# Patient Record
Sex: Male | Born: 1945 | Race: White | Hispanic: No | Marital: Married | Smoking: Never smoker
Health system: Southern US, Community
[De-identification: ages and names within clinical notes are randomized; demographics above are authoritative.]

## PROBLEM LIST (undated history)

## (undated) DIAGNOSIS — E785 Hyperlipidemia, unspecified: Secondary | ICD-10-CM

## (undated) DIAGNOSIS — I10 Essential (primary) hypertension: Secondary | ICD-10-CM

## (undated) DIAGNOSIS — I2699 Other pulmonary embolism without acute cor pulmonale: Secondary | ICD-10-CM

## (undated) DIAGNOSIS — I4892 Unspecified atrial flutter: Secondary | ICD-10-CM

## (undated) HISTORY — DX: Essential (primary) hypertension: I10

## (undated) HISTORY — DX: Hyperlipidemia, unspecified: E78.5

## (undated) HISTORY — PX: NO PAST SURGERIES: SHX2092

---

## 2004-07-25 ENCOUNTER — Ambulatory Visit: Payer: Self-pay | Admitting: Internal Medicine

## 2004-08-06 ENCOUNTER — Ambulatory Visit: Payer: Self-pay | Admitting: Internal Medicine

## 2004-08-06 ENCOUNTER — Inpatient Hospital Stay: Payer: Self-pay | Admitting: Internal Medicine

## 2004-08-22 ENCOUNTER — Ambulatory Visit: Payer: Self-pay | Admitting: Internal Medicine

## 2004-09-18 ENCOUNTER — Ambulatory Visit: Payer: Self-pay | Admitting: Internal Medicine

## 2005-05-22 ENCOUNTER — Emergency Department: Payer: Self-pay | Admitting: Emergency Medicine

## 2005-12-05 ENCOUNTER — Emergency Department: Payer: Self-pay | Admitting: Internal Medicine

## 2006-07-04 IMAGING — CT CT STONE STUDY
1 of 2 series · 16 of 32 positions shown, 20 images · non-contrast
Comparison: none

REASON FOR EXAM: Abdominal pain. Rm 11
COMMENTS:

[Series 2: stone · axial · 0.66mm/px · z∈[-506,-84]mm · 16 of 153 slices shown, 20 images]
[im 6/153  soft-tissue]
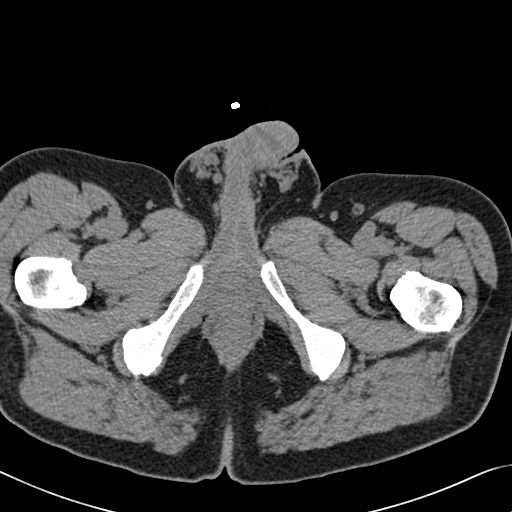
[im 6/153  bone]
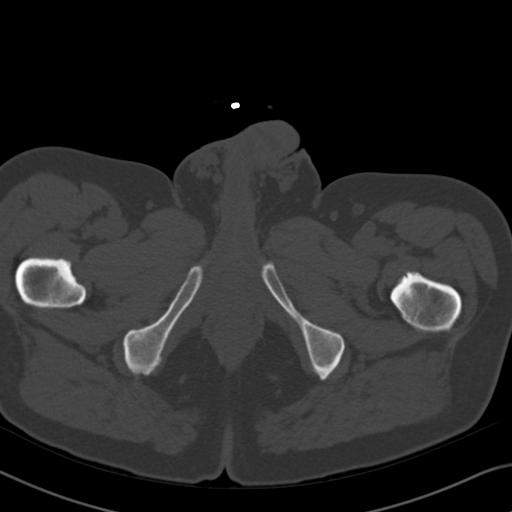
[im 17/153  soft-tissue]
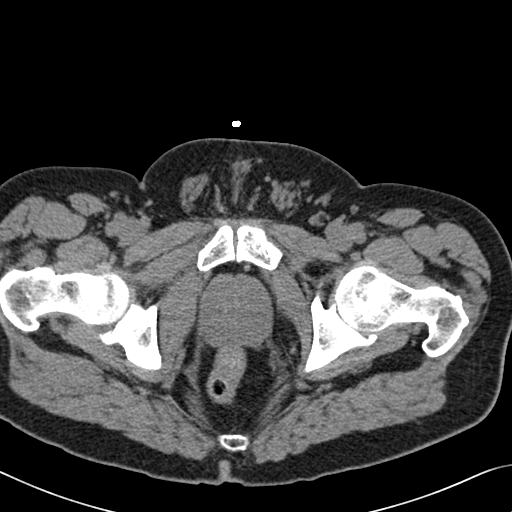
[im 28/153  soft-tissue]
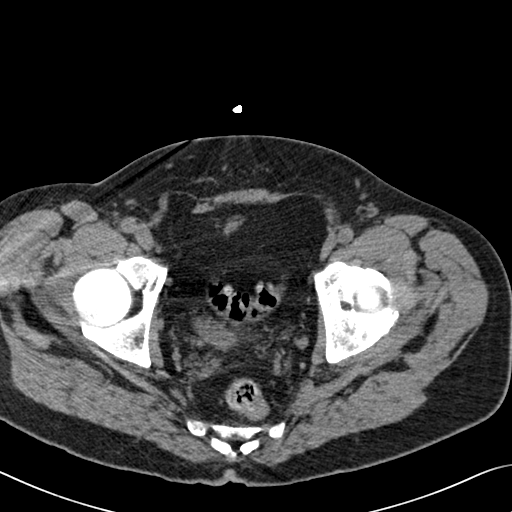
[im 39/153  soft-tissue]
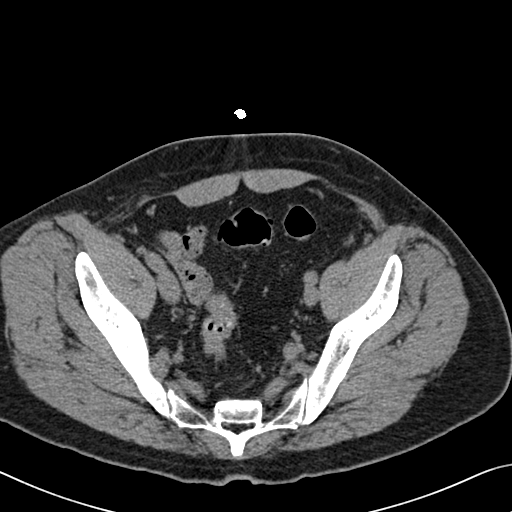
[im 49/153  soft-tissue]
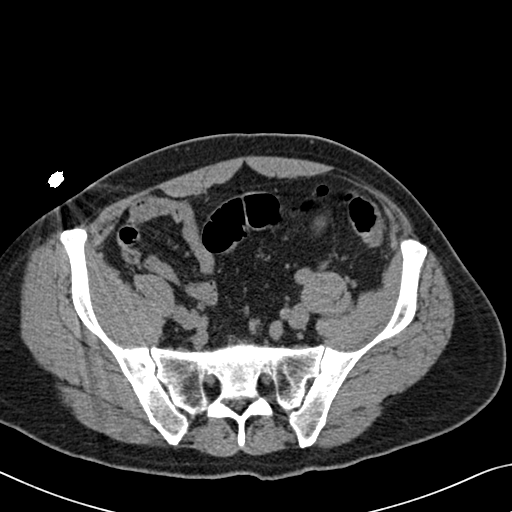
[im 60/153  soft-tissue]
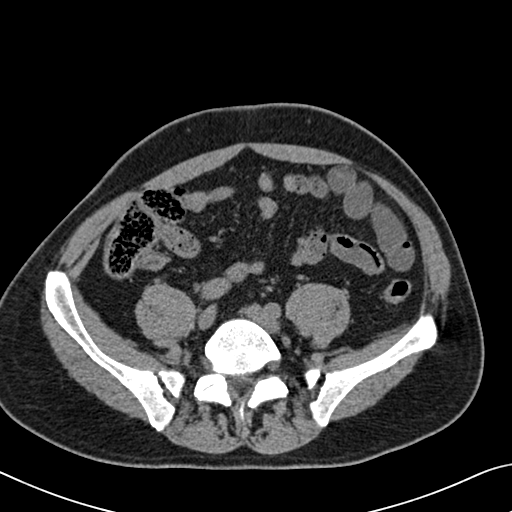
[im 71/153  soft-tissue]
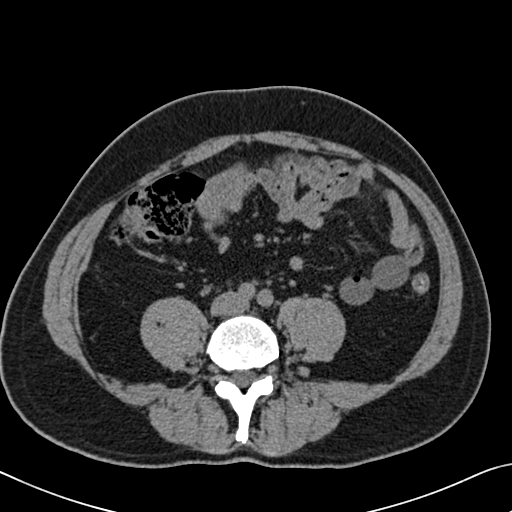
[im 82/153  soft-tissue]
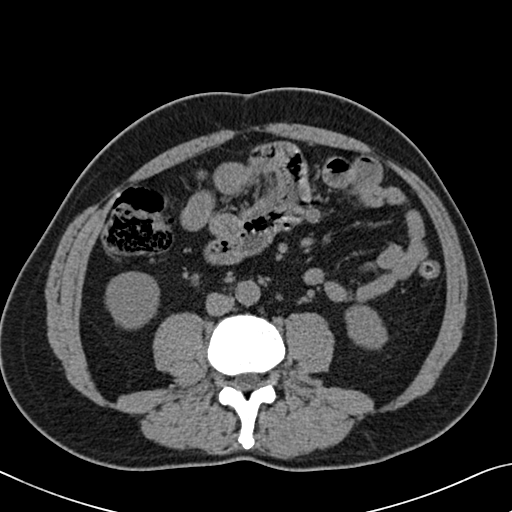
[im 93/153  soft-tissue]
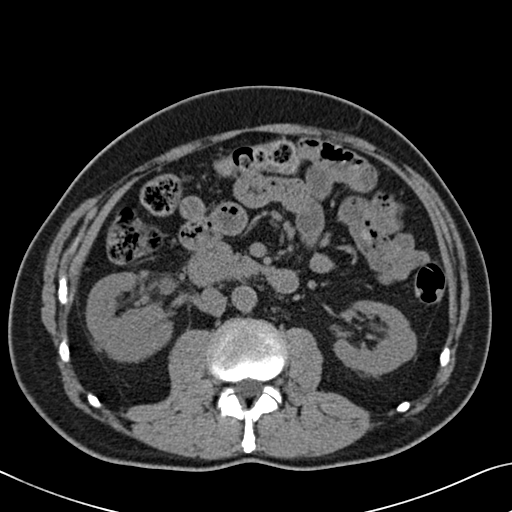
[im 93/153  bone]
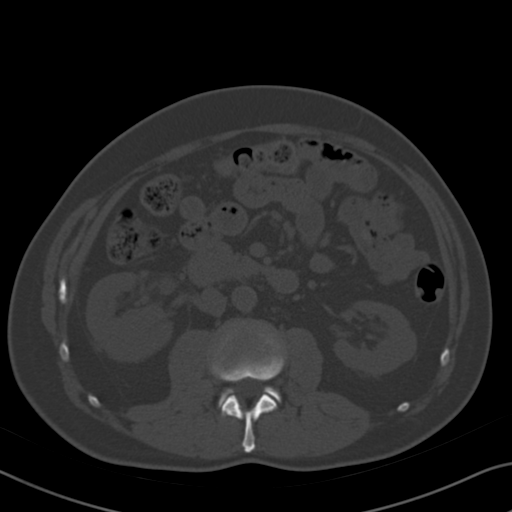
[im 104/153  soft-tissue]
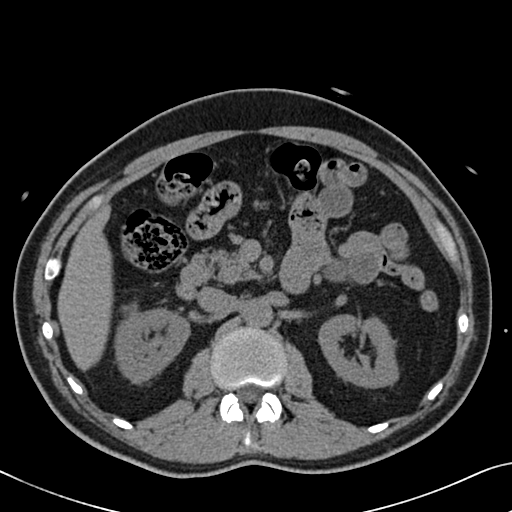
[im 115/153  soft-tissue]
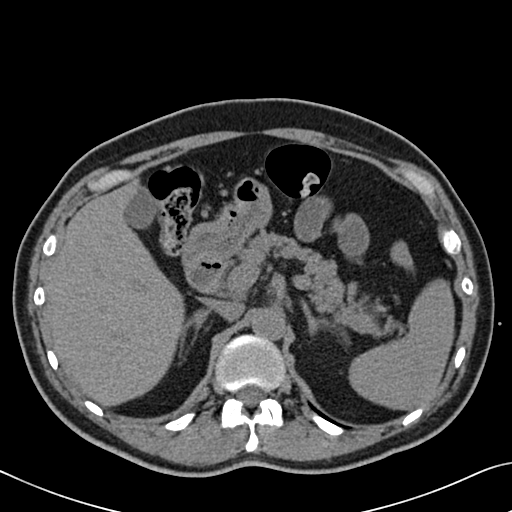
[im 125/153  soft-tissue]
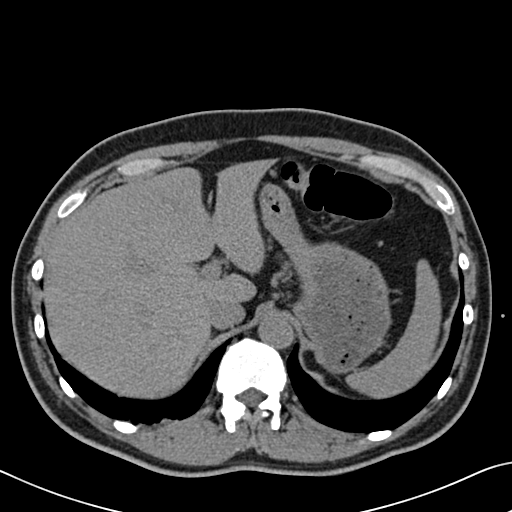
[im 131/153  lung]
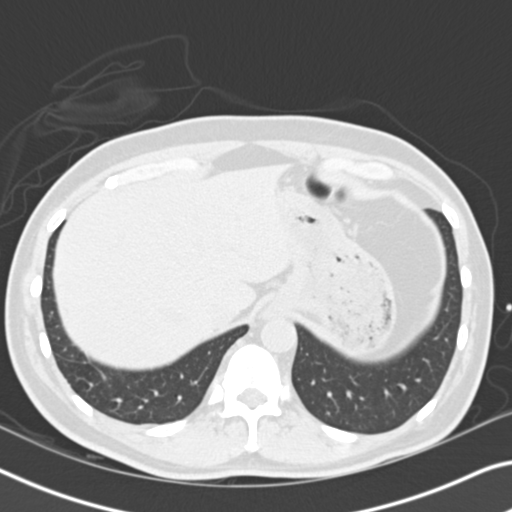
[im 136/153  soft-tissue]
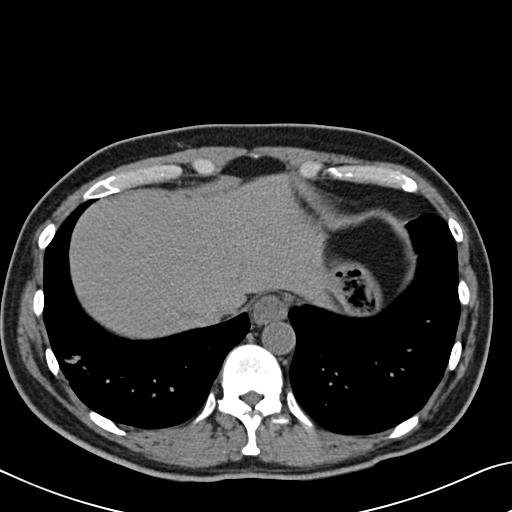
[im 136/153  lung]
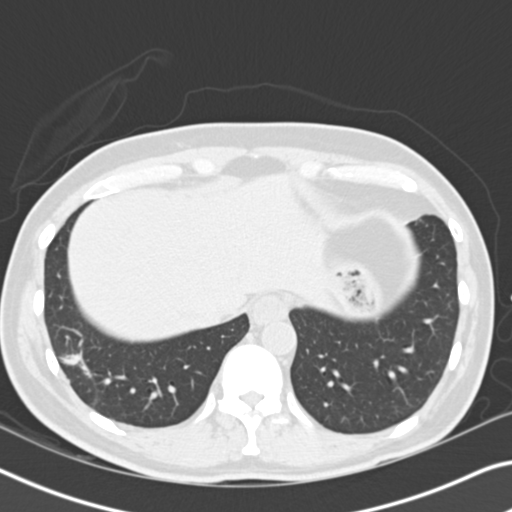
[im 142/153  lung]
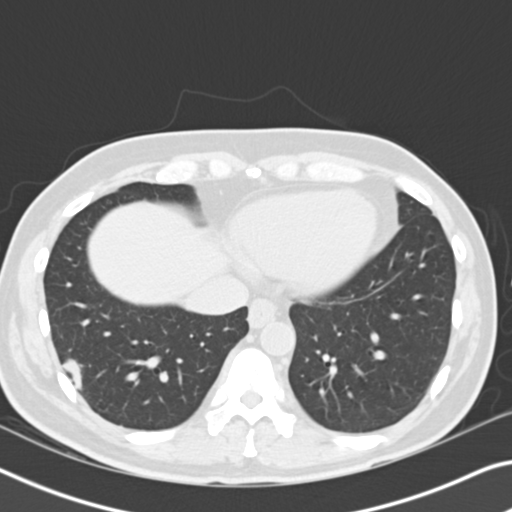
[im 147/153  soft-tissue]
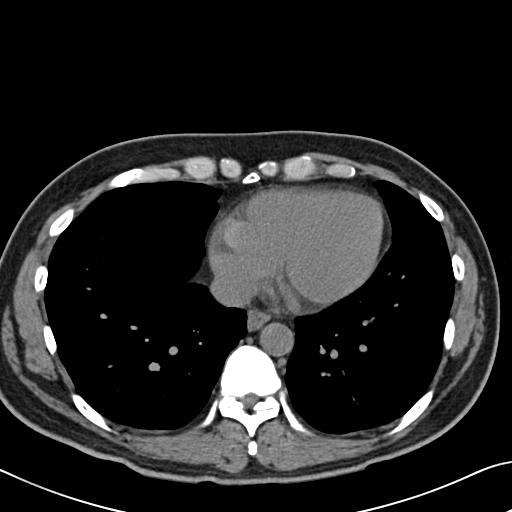
[im 147/153  lung]
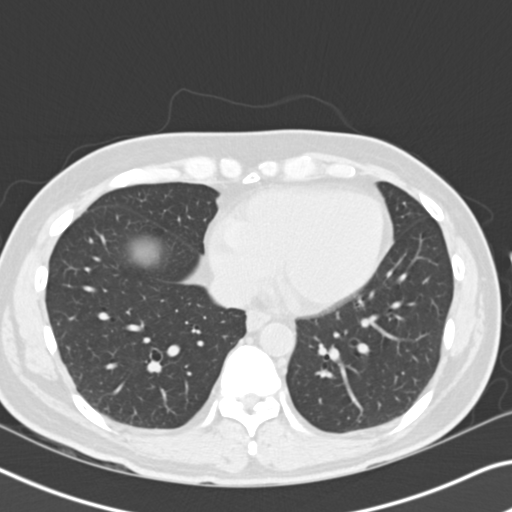

[16 of 32 positions shown; findings below may reference images not displayed]

PROCEDURE:     CT  - CT ABDOMEN /PELVIS WO (STONE)  - May 22, 2005  [DATE]

RESULT:     Unenhanced CT scan is performed for RIGHT flank pain.  The
report was initially faxed to the emergency room.  There is noted a distal
RIGHT ureteral calculus above the ureterovesical junction measuring 2.8 mm.
Very mild hydronephrosis is noted on the RIGHT.  No renal calculi are noted
in either kidney.  No fluid is noted in the abdomen or pelvis.  No major
organ abnormalities are noted.

In the lung bases there is noted residual possible scarring or atelectasis
in the RIGHT lung base and a previous effusion and possible atelectasis on
the previous CT of 08/08/04.
IMPRESSION: Distal RIGHT ureteral calculus with mild RIGHT hydronephrosis.

## 2008-03-13 ENCOUNTER — Ambulatory Visit: Payer: Self-pay | Admitting: Gastroenterology

## 2011-04-23 ENCOUNTER — Encounter: Payer: Self-pay | Admitting: Family Medicine

## 2014-08-01 ENCOUNTER — Ambulatory Visit: Payer: Self-pay | Admitting: Family Medicine

## 2014-08-15 ENCOUNTER — Encounter: Payer: Self-pay | Admitting: Family Medicine

## 2014-08-15 ENCOUNTER — Ambulatory Visit (INDEPENDENT_AMBULATORY_CARE_PROVIDER_SITE_OTHER): Payer: Medicare Other | Admitting: Family Medicine

## 2014-08-15 VITALS — BP 110/68 | HR 82 | Temp 98.0°F | Resp 16 | Ht 70.0 in | Wt 162.5 lb

## 2014-08-15 DIAGNOSIS — M7130 Other bursal cyst, unspecified site: Secondary | ICD-10-CM | POA: Diagnosis not present

## 2014-08-15 DIAGNOSIS — I10 Essential (primary) hypertension: Secondary | ICD-10-CM | POA: Insufficient documentation

## 2014-08-15 DIAGNOSIS — M1990 Unspecified osteoarthritis, unspecified site: Secondary | ICD-10-CM | POA: Insufficient documentation

## 2014-08-15 DIAGNOSIS — E785 Hyperlipidemia, unspecified: Secondary | ICD-10-CM | POA: Insufficient documentation

## 2014-08-15 MED ORDER — BENAZEPRIL-HYDROCHLOROTHIAZIDE 10-12.5 MG PO TABS: 10.0000 | ORAL_TABLET | Freq: Once | ORAL | Status: DC

## 2014-08-15 MED ORDER — METOPROLOL SUCCINATE ER 25 MG PO TB24: 25.0000 mg | ORAL_TABLET | Freq: Once | ORAL | Status: DC

## 2014-08-15 NOTE — Patient Instructions (Signed)
F/U 4 MO 

## 2014-08-15 NOTE — Progress Notes (Signed)
Name: Kevin Acosta L Acosta   MRN: [id number]    DOB: 08-01-1945   Date:08/15/2014       Progress Note  Subjective  Chief Complaint  Chief Complaint  Patient presents with  . Hyperlipidemia  . Hypertension    Hyperlipidemia This is a chronic problem. The current episode started more than 1 year ago. The problem is controlled. Recent lipid tests were reviewed and are normal. Factors aggravating his hyperlipidemia include fatty foods. Pertinent negatives include no chest pain, focal weakness, myalgias or shortness of breath. Current antihyperlipidemic treatment includes statins. The current treatment provides moderate improvement of lipids. There are no compliance problems.  Risk factors for coronary artery disease include dyslipidemia, hypertension and male sex.  Hypertension This is a chronic problem. The current episode started more than 1 year ago. The problem is unchanged. The problem is controlled. Pertinent negatives include no blurred vision, chest pain, headaches, neck pain, orthopnea, palpitations or shortness of breath. There are no associated agents to hypertension. Past treatments include beta blockers, ACE inhibitors and calcium channel blockers. The current treatment provides moderate improvement. There are no compliance problems.   Arthritis Presents for initial visit. The disease course has been worsening. He complains of pain, stiffness and joint swelling. Affected locations include the right MCP, left MCP, left PIP and left DIP. Associated symptoms include pain at night. Pertinent negatives include no diarrhea, dysuria, fever or weight loss. His past medical history is significant for osteoarthritis. His pertinent risk factors include overuse. Past treatments include acetaminophen. The treatment provided mild relief.      Past Medical History  Diagnosis Date  . Hyperlipidemia   . Hypertension     History  Substance Use Topics  . Smoking status: Never Smoker   . Smokeless  tobacco: Not on file  . Alcohol Use: No     Current outpatient prescriptions:  .  aspirin 81 MG tablet, Take 81 mg by mouth daily., Disp: , Rfl:  .  atorvastatin (LIPITOR) 20 MG tablet, Take 20 mg by mouth daily., Disp: , Rfl:  .  benazepril-hydrochlorthiazide (LOTENSIN HCT) 10-12.5 MG per tablet, Take 10-12.5 tablets by mouth once., Disp: 90 tablet, Rfl: 1 .  metoprolol succinate (TOPROL-XL) 25 MG 24 hr tablet, Take 1 tablet (25 mg total) by mouth once., Disp: 90 tablet, Rfl: 1  No Known Allergies  Review of Systems  Constitutional: Negative for fever, chills and weight loss.  HENT: Negative for congestion, hearing loss, sore throat and tinnitus.   Eyes: Negative for blurred vision, double vision and redness.  Respiratory: Negative for cough, hemoptysis and shortness of breath.   Cardiovascular: Negative for chest pain, palpitations, orthopnea, claudication and leg swelling.  Gastrointestinal: Negative for heartburn, nausea, vomiting, diarrhea, constipation and blood in stool.  Genitourinary: Negative for dysuria, urgency, frequency and hematuria.  Musculoskeletal: Positive for joint pain, joint swelling, arthritis and stiffness. Negative for myalgias, back pain, falls and neck pain.  Skin: Negative for itching.  Neurological: Negative for dizziness, tingling, tremors, focal weakness, seizures, loss of consciousness, weakness and headaches.  Endo/Heme/Allergies: Does not bruise/bleed easily.  Psychiatric/Behavioral: Negative for depression and substance abuse. The patient is not nervous/anxious and does not have insomnia.      Objective  Filed Vitals:   08/15/14 0738  BP: 110/68  Pulse: 82  Temp: 98 F (36.7 C)  TempSrc: Oral  Resp: 16  Height: 5\' 10"  (1.778 m)  Weight: 162 lb 8 oz (73.71 kg)  SpO2: 97%     Physical  Exam  Constitutional: He is oriented to person, place, and time and well-developed, well-nourished, and in no distress.  HENT:  Head: Normocephalic.   Eyes: EOM are normal. Pupils are equal, round, and reactive to light.  Neck: Normal range of motion. Neck supple. No thyromegaly present.  Cardiovascular: Normal rate, regular rhythm and normal heart sounds.   No murmur heard. Pulmonary/Chest: Effort normal and breath sounds normal. No respiratory distress. He has no wheezes.  Abdominal: Soft. Bowel sounds are normal.  Musculoskeletal: He exhibits no edema.  ARTHRITIC CHANGES WITH SYNOVIAL CYST ON ONE DIP JT  Lymphadenopathy:    He has no cervical adenopathy.  Neurological: He is alert and oriented to person, place, and time. No cranial nerve deficit. Gait normal. Coordination normal.  Skin: Skin is warm and dry. No rash noted.  Psychiatric: Affect and judgment normal.      Assessment & Plan  1. Essential hypertension STABLE - metoprolol succinate (TOPROL-XL) 25 MG 24 hr tablet; Take 1 tablet (25 mg total) by mouth once.  Dispense: 90 tablet; Refill: 1  2. Arthritis USE OTC TYLENOL PRN  3. Synovial cyst REASSSURANCE

## 2014-10-17 ENCOUNTER — Other Ambulatory Visit: Payer: Self-pay | Admitting: Emergency Medicine

## 2014-10-17 MED ORDER — BENAZEPRIL-HYDROCHLOROTHIAZIDE 10-12.5 MG PO TABS: 10.0000 | ORAL_TABLET | Freq: Once | ORAL | Status: DC

## 2014-12-18 ENCOUNTER — Ambulatory Visit: Payer: Medicare Other | Admitting: Family Medicine

## 2014-12-25 ENCOUNTER — Ambulatory Visit (INDEPENDENT_AMBULATORY_CARE_PROVIDER_SITE_OTHER): Payer: Medicare Other | Admitting: Family Medicine

## 2014-12-25 ENCOUNTER — Encounter: Payer: Self-pay | Admitting: Family Medicine

## 2014-12-25 VITALS — BP 128/68 | HR 87 | Temp 98.0°F | Resp 18 | Ht 70.0 in | Wt 167.1 lb

## 2014-12-25 DIAGNOSIS — E785 Hyperlipidemia, unspecified: Secondary | ICD-10-CM

## 2014-12-25 DIAGNOSIS — I10 Essential (primary) hypertension: Secondary | ICD-10-CM | POA: Diagnosis not present

## 2014-12-25 DIAGNOSIS — N4340 Spermatocele of epididymis, unspecified: Secondary | ICD-10-CM

## 2014-12-25 NOTE — Progress Notes (Signed)
Name: Kevin BergerDaniel L Acosta   MRN: [id number]    DOB: 03/28/1945   Date:12/25/2014       Progress Note  Subjective  Chief Complaint  Chief Complaint  Patient presents with  . Hyperlipidemia  . Hypertension    HPI  Hypertension   Patient presents for follow-up of hypertension. It has been present for over 5 years.  Patient states that there is compliance with medical regimen which consists of Lotensin HCT 10-12 0.5 once daily metoprolol 25 mg once daily. . There is no end organ disease. Cardiac risk factors include hypertension hyperlipidemia and diabetes.  Exercise regimen consist of walking on a daily basis nearly .  Diet consist of low-sodium .  Hyperlipidemia  Patient has a history of hyperlipidemia for 5 years or moreyears.  Current medical regimen consist of atorvastatin 20 mg daily at bedtime .  Compliance is good .  Diet and exercise are currently followed regularly .  Risk factors for cardiovascular disease include hyperlipidemia hypertension and age .   There have been no side effects from the medication.    Spermatocele  Patient is noted a lump in his scrotal area the last several months. He has just begun to start checking himself on a regular basis. There is no associated pain and no trauma. He has not had a vasectomy. There is no pain with or difficulty with urination and nocturia or frequency. There's been no history of hematuria at any point    Past Medical History  Diagnosis Date  . Hyperlipidemia   . Hypertension     Social History  Substance Use Topics  . Smoking status: Never Smoker   . Smokeless tobacco: Not on file  . Alcohol Use: No     Current outpatient prescriptions:  .  aspirin 81 MG tablet, Take 81 mg by mouth daily., Disp: , Rfl:  .  atorvastatin (LIPITOR) 20 MG tablet, Take 20 mg by mouth daily., Disp: , Rfl:  .  benazepril-hydrochlorthiazide (LOTENSIN HCT) 10-12.5 MG per tablet, Take 10-12.5 tablets by mouth once., Disp: 90 tablet, Rfl: 0 .   metoprolol succinate (TOPROL-XL) 25 MG 24 hr tablet, Take 1 tablet (25 mg total) by mouth once., Disp: 90 tablet, Rfl: 1  No Known Allergies  Review of Systems  Constitutional: Negative for fever, chills and weight loss.  HENT: Negative for congestion, hearing loss, sore throat and tinnitus.   Eyes: Negative for blurred vision, double vision and redness.  Respiratory: Negative for cough, hemoptysis and shortness of breath.   Cardiovascular: Negative for chest pain, palpitations, orthopnea, claudication and leg swelling.  Gastrointestinal: Negative for heartburn, nausea, vomiting, diarrhea, constipation and blood in stool.  Genitourinary: Negative for dysuria, urgency, frequency and hematuria.       Small nodular area in the scrotum  Musculoskeletal: Negative for myalgias, back pain, joint pain, falls and neck pain.  Skin: Negative for itching.  Neurological: Negative for dizziness, tingling, tremors, focal weakness, seizures, loss of consciousness, weakness and headaches.  Endo/Heme/Allergies: Does not bruise/bleed easily.  Psychiatric/Behavioral: Negative for depression and substance abuse. The patient is not nervous/anxious and does not have insomnia.      Objective  Filed Vitals:   12/25/14 1138  BP: 128/68  Pulse: 87  Temp: 98 F (36.7 C)  Resp: 18  Height: 5\' 10"  (1.778 m)  Weight: 167 lb 1 oz (75.779 kg)  SpO2: 97%     Physical Exam  Constitutional: He is oriented to person, place, and time and well-developed, well-nourished,  and in no distress.  HENT:  Head: Normocephalic.  Eyes: EOM are normal. Pupils are equal, round, and reactive to light.  Neck: Normal range of motion. Neck supple. No thyromegaly present.  Cardiovascular: Normal rate, regular rhythm and normal heart sounds.   No murmur heard. Pulmonary/Chest: Effort normal and breath sounds normal. No respiratory distress. He has no wheezes.  Abdominal: Soft. Bowel sounds are normal.  Genitourinary:  There is  what feels to be a small spermatocele on the left testicle. It is mobile and nontender not at her to the testicle  Musculoskeletal: Normal range of motion. He exhibits no edema.  Lymphadenopathy:    He has no cervical adenopathy.  Neurological: He is alert and oriented to person, place, and time. No cranial nerve deficit. Gait normal. Coordination normal.  Skin: Skin is warm and dry. No rash noted.  Psychiatric: Affect and judgment normal.      Assessment & Plan  1. Essential hypertension Well-controlled - Comprehensive Metabolic Panel (CMET)  2. Hyperlipidemia Lab work - Lipid Profile - TSH  3. Spermatocele Reassurance. She did become tender or enlarged for her urologist

## 2015-01-15 LAB — COMPREHENSIVE METABOLIC PANEL
ALBUMIN: 4.3 g/dL (ref 3.6–4.8)
ALT: 22 IU/L (ref 0–44)
AST: 20 IU/L (ref 0–40)
Albumin/Globulin Ratio: 1.5 (ref 1.1–2.5)
Alkaline Phosphatase: 75 IU/L (ref 39–117)
BUN / CREAT RATIO: 19 (ref 10–22)
BUN: 19 mg/dL (ref 8–27)
Bilirubin Total: 0.7 mg/dL (ref 0.0–1.2)
CO2: 28 mmol/L (ref 18–29)
CREATININE: 1 mg/dL (ref 0.76–1.27)
Calcium: 9.8 mg/dL (ref 8.6–10.2)
Chloride: 97 mmol/L (ref 97–106)
GFR, EST AFRICAN AMERICAN: 88 mL/min/{1.73_m2} (ref >59)
GFR, EST NON AFRICAN AMERICAN: 76 mL/min/{1.73_m2} (ref >59)
Globulin, Total: 2.8 g/dL (ref 1.5–4.5)
Glucose: 114 mg/dL — ABNORMAL HIGH (ref 65–99)
Potassium: 4.7 mmol/L (ref 3.5–5.2)
Sodium: 139 mmol/L (ref 136–144)
TOTAL PROTEIN: 7.1 g/dL (ref 6.0–8.5)

## 2015-01-15 LAB — LIPID PANEL
Chol/HDL Ratio: 5.3 ratio units — ABNORMAL HIGH (ref 0.0–5.0)
Cholesterol, Total: 217 mg/dL — ABNORMAL HIGH (ref 100–199)
HDL: 41 mg/dL (ref >39)
LDL CALC: 148 mg/dL — AB (ref 0–99)
Triglycerides: 140 mg/dL (ref 0–149)
VLDL CHOLESTEROL CAL: 28 mg/dL (ref 5–40)

## 2015-01-15 LAB — TSH: TSH: 2.88 u[IU]/mL (ref 0.450–4.500)

## 2015-01-18 ENCOUNTER — Telehealth: Payer: Self-pay | Admitting: Emergency Medicine

## 2015-01-18 ENCOUNTER — Telehealth: Payer: Self-pay | Admitting: Family Medicine

## 2015-01-18 NOTE — Telephone Encounter (Signed)
Pt is asking for you all to call him and give his lab results to him again. Ph# is [phone].

## 2015-01-18 NOTE — Telephone Encounter (Signed)
Patient stated he is not taking any medication. Lipitor makes his feet hurt. Do not want to start back taking it. Will exercise and loose weight.

## 2015-01-21 ENCOUNTER — Other Ambulatory Visit: Payer: Self-pay

## 2015-01-21 MED ORDER — ATORVASTATIN CALCIUM 40 MG PO TABS: 20.0000 mg | ORAL_TABLET | Freq: Every day | ORAL | Status: DC

## 2015-01-21 NOTE — Telephone Encounter (Signed)
Left vm for pt to return my call.  

## 2015-02-19 ENCOUNTER — Other Ambulatory Visit: Payer: Self-pay | Admitting: Family Medicine

## 2015-02-19 DIAGNOSIS — I10 Essential (primary) hypertension: Secondary | ICD-10-CM

## 2015-02-19 MED ORDER — METOPROLOL SUCCINATE ER 25 MG PO TB24: 25.0000 mg | ORAL_TABLET | Freq: Once | ORAL | Status: DC

## 2015-02-26 ENCOUNTER — Other Ambulatory Visit: Payer: Self-pay | Admitting: Family Medicine

## 2015-04-16 ENCOUNTER — Other Ambulatory Visit: Payer: Self-pay | Admitting: Family Medicine

## 2015-05-27 ENCOUNTER — Ambulatory Visit: Payer: Medicare Other | Admitting: Family Medicine

## 2015-05-27 ENCOUNTER — Ambulatory Visit
Admission: EM | Admit: 2015-05-27 | Discharge: 2015-05-27 | Disposition: A | Discharge status: Home / Self Care | Payer: Medicare Other | Attending: Family Medicine | Admitting: Family Medicine

## 2015-05-27 ENCOUNTER — Encounter: Payer: Self-pay | Admitting: *Deleted

## 2015-05-27 DIAGNOSIS — J40 Bronchitis, not specified as acute or chronic: Secondary | ICD-10-CM

## 2015-05-27 DIAGNOSIS — J0110 Acute frontal sinusitis, unspecified: Secondary | ICD-10-CM

## 2015-05-27 LAB — RAPID INFLUENZA A&B ANTIGENS: Influenza B (ARMC): NEGATIVE

## 2015-05-27 LAB — RAPID INFLUENZA A&B ANTIGENS (ARMC ONLY): INFLUENZA A (ARMC): NEGATIVE

## 2015-05-27 MED ORDER — AMOXICILLIN-POT CLAVULANATE 875-125 MG PO TABS: 1.0000 | ORAL_TABLET | Freq: Two times a day (BID) | ORAL | Status: DC

## 2015-05-27 MED ORDER — LORATADINE 10 MG PO TABS: 10.0000 mg | ORAL_TABLET | Freq: Every day | ORAL | Status: DC

## 2015-05-27 MED ORDER — BENZONATATE 100 MG PO CAPS: 100.0000 mg | ORAL_CAPSULE | Freq: Three times a day (TID) | ORAL | Status: DC | PRN

## 2015-05-27 NOTE — ED Provider Notes (Signed)
[facility]  ____________________________________________  Time seen: Approximately 10:42 AM  I have reviewed the triage vital signs and the nursing notes.   HISTORY  Chief Complaint Cough and Nasal Congestion   HPI Kevin Acosta is a 70 y.o. male   presents with a complaint of one week of runny nose, nasal congestion, sinus congestion and cough. Patient reports that initially he was only having nasal drainage and sinus congestion but states the last few days he has developed a dry hacking cough. Patient states that sinuses at this point feel clogged with mild pressure in his forehead that's consistent with previous sinus pressure. Reports some watery eyes. Denies sneezing. Denies sore throat. States the cough is generally worse in the afternoon and evenings. Reports he does have a history of seasonal allergies but reports that his allergies have not bothered him in the last few years. Reports his son and their family have recently had the flu but states he has not directly been around them. Denies other known sick contacts.. Denies fevers.   Patient reports that he did take an over-the-counter cough and congestion medicine the other day and noticed that his blood pressure went up while taking that medicine, states that he will stop using that medicine. States as soon as he stopped the medicine at home his blood pressure returned to his normal 120s/80s.   Denies chest pain, shortness of breath, chest pain with deep breath, palpitations, dizziness, weakness, vision changes, headaches, abdominal pain, dysuria, neck pain, back pain, rash, extremity pain, extremity swelling.  PCP: Charlette Caffey   Past Medical History  Diagnosis Date  . Hyperlipidemia   . Hypertension     Patient Active Problem List   Diagnosis Date Noted  . Hyperlipemia 08/15/2014  . Essential hypertension 08/15/2014  . Arthritis 08/15/2014  . Synovial cyst 08/15/2014    Past Surgical History  Procedure  Laterality Date  . No past surgeries      Current Outpatient Rx  Name  Route  Sig  Dispense  Refill  . aspirin 81 MG tablet   Oral   Take 81 mg by mouth daily.         Marland Kitchen atorvastatin (LIPITOR) 40 MG tablet   Oral   Take 0.5 tablets (20 mg total) by mouth daily.   30 tablet   3   . benazepril-hydrochlorthiazide (LOTENSIN HCT) 10-12.5 MG tablet      1 Tablet, Oral, daily   90 tablet   0   . metoprolol succinate (TOPROL-XL) 25 MG 24 hr tablet      1 Tablet ER 24HR, Oral, daily   90 tablet   0     Allergies Review of patient's allergies indicates no known allergies.  Family History  Problem Relation Age of Onset  . Diabetes Mother     Social History Social History  Substance Use Topics  . Smoking status: Never Smoker   . Smokeless tobacco: None  . Alcohol Use: No    Review of Systems Constitutional: No fever/chills Eyes: No visual changes. ENT: No sore throat.Positive runny nose, congestion and cough. Cardiovascular: Denies chest pain. Respiratory: Denies shortness of breath. Gastrointestinal: No abdominal pain.  No nausea, no vomiting.  No diarrhea.  No constipation. Genitourinary: Negative for dysuria. Musculoskeletal: Negative for back pain. Skin: Negative for rash. Neurological: Negative for headaches, focal weakness or numbness.  10-point ROS otherwise negative.  ____________________________________________   PHYSICAL EXAM:  VITAL SIGNS: ED Triage Vitals  Enc Vitals Group  BP 05/27/15 1000 142/78 mmHg     Pulse Rate 05/27/15 1000 102 Recheck 92     Resp 05/27/15 1000 16     Temp 05/27/15 1000 99.2 F (37.3 C)     Temp Source 05/27/15 1000 Oral     SpO2 05/27/15 1000 99 %     Weight 05/27/15 1000 168 lb (76.204 kg)     Height 05/27/15 1000  (1.778 m)     Head Cir --      Peak Flow --      Pain Score --      Pain Loc --      Pain Edu? --      Excl. in GC? --    Today's Vitals   05/27/15 1000 05/27/15 1052  BP: 142/78  152/89  Pulse: 102 88  Temp: 99.2 F (37.3 C)   TempSrc: Oral   Resp: 16 18  Height:  (1.778 m)   Weight: 168 lb (76.204 kg)   SpO2: 99%     Constitutional: Alert and oriented. Well appearing and in no acute distress. Eyes: Conjunctivae are normal. PERRL. EOMI. Head: Atraumatic.Mild tenderness to palpation bilateral frontal sinuses; no maxillary sinus tenderness to palpation. No swelling. No erythema.   Ears: no erythema, normal TMs bilaterally.   Nose: nasal congestion with bilateral nasal turbinate erythema and edema.   Mouth/Throat: Mucous membranes are moist.  Oropharynx non-erythematous.No tonsillar swelling or exudate.  Neck: No stridor.  No cervical spine tenderness to palpation. Hematological/Lymphatic/Immunilogical: No cervical lymphadenopathy. Cardiovascular: Normal rate, regular rhythm. Grossly normal heart sounds.  Good peripheral circulation. Respiratory: Normal respiratory effort.  No retractions. Lungs CTAB. No wheezes, rales or rhonchi. Good air movement. Dry intermittent cough noted. Gastrointestinal: Soft and nontender. No distention.  No CVA tenderness. Musculoskeletal: No lower or upper extremity tenderness nor edema.  Bilateral pedal pulses equal and easily palpated. No cervical, thoracic or lumbar tenderness to palpation.  Neurologic:  Normal speech and language. No gross focal neurologic deficits are appreciated. No gait instability. Skin:  Skin is warm, dry and intact. No rash noted. Psychiatric: Mood and affect are normal. Speech and behavior are normal.  ____________________________________________   LABS (all labs ordered are listed, but only abnormal results are displayed)  Labs Reviewed  RAPID INFLUENZA A&B ANTIGENS ([facility] ONLY)     INITIAL IMPRESSION / ASSESSMENT AND PLAN / ED COURSE  Pertinent labs & imaging results that were available during my care of the patient were reviewed by me and considered in my medical decision making (see chart  for details).  Well-appearing patient. No acute distress. Present for the complaints of 1 week of runny nose, nasal congestion, sinus pressure, sinus drainage and cough. Lungs clear throughout. Abdomen soft and nontender. Mild frontal sinus tenderness with nasal drainage. As with patient with multiple co morbidities and with suspicious of sinusitis and mild bronchitis will treat patient with oral Augmentin, when necessary Tessalon Perles and Claritin. Suspect some allergic rhinitis. Encouraged rest, fluids, vital sign monitoring at home and PCP follow-up.Encourage patient to be careful of  taking over-the-counter medications, as to not increase his blood pressure and patient reports will be from now on.   Discussed follow up with Primary care physician this week. Discussed follow up and return parameters including no resolution or any worsening concerns. Patient verbalized understanding and agreed to plan.    ____________________________________________   FINAL CLINICAL IMPRESSION(S) / ED DIAGNOSES  Final diagnoses:  Acute frontal sinusitis, recurrence not specified  Bronchitis  Note: This dictation was prepared with Dragon dictation along with smaller phrase technology. Any transcriptional errors that result from this process are unintentional.    Renford DillsLindsey Xzaria Teo, NP 05/27/15 1103  Renford DillsLindsey Sarahjane Matherly, NP 05/27/15 1103

## 2015-05-27 NOTE — ED Notes (Signed)
Non-productive cough and hed/nose congestion x1 week. Denied fever.

## 2015-05-27 NOTE — Discharge Instructions (Signed)
Take medication as prescribed. Rest. Drink plenty of fluids.  ° °Follow up with your primary care physician this week as needed. Return to Urgent care for new or worsening concerns.  ° ° °Sinusitis, Adult °Sinusitis is redness, soreness, and inflammation of the paranasal sinuses. Paranasal sinuses are air pockets within the bones of your face. They are located beneath your eyes, in the middle of your forehead, and above your eyes. In healthy paranasal sinuses, mucus is able to drain out, and air is able to circulate through them by way of your nose. However, when your paranasal sinuses are inflamed, mucus and air can become trapped. This can allow bacteria and other germs to grow and cause infection. °Sinusitis can develop quickly and last only a short time (acute) or continue over a long period (chronic). Sinusitis that lasts for more than 12 weeks is considered chronic. °CAUSES °Causes of sinusitis include: °· Allergies. °· Structural abnormalities, such as displacement of the cartilage that separates your nostrils (deviated septum), which can decrease the air flow through your nose and sinuses and affect sinus drainage. °· Functional abnormalities, such as when the small hairs (cilia) that line your sinuses and help remove mucus do not work properly or are not present. °SIGNS AND SYMPTOMS °Symptoms of acute and chronic sinusitis are the same. The primary symptoms are pain and pressure around the affected sinuses. Other symptoms include: °· Upper toothache. °· Earache. °· Headache. °· Bad breath. °· Decreased sense of smell and taste. °· A cough, which worsens when you are lying flat. °· Fatigue. °· Fever. °· Thick drainage from your nose, which often is green and may contain pus (purulent). °· Swelling and warmth over the affected sinuses. °DIAGNOSIS °Your health care provider will perform a physical exam. During your exam, your health care provider may perform any of the following to help determine if you have  acute sinusitis or chronic sinusitis: °· Look in your nose for signs of abnormal growths in your nostrils (nasal polyps). °· Tap over the affected sinus to check for signs of infection. °· View the inside of your sinuses using an imaging device that has a light attached (endoscope). °If your health care provider suspects that you have chronic sinusitis, one or more of the following tests may be recommended: °· Allergy tests. °· Nasal culture. A sample of mucus is taken from your nose, sent to a lab, and screened for bacteria. °· Nasal cytology. A sample of mucus is taken from your nose and examined by your health care provider to determine if your sinusitis is related to an allergy. °TREATMENT °Most cases of acute sinusitis are related to a viral infection and will resolve on their own within 10 days. Sometimes, medicines are prescribed to help relieve symptoms of both acute and chronic sinusitis. These may include pain medicines, decongestants, nasal steroid sprays, or saline sprays. °However, for sinusitis related to a bacterial infection, your health care provider will prescribe antibiotic medicines. These are medicines that will help kill the bacteria causing the infection. °Rarely, sinusitis is caused by a fungal infection. In these cases, your health care provider will prescribe antifungal medicine. °For some cases of chronic sinusitis, surgery is needed. Generally, these are cases in which sinusitis recurs more than 3 times per year, despite other treatments. °HOME CARE INSTRUCTIONS °· Drink plenty of water. Water helps thin the mucus so your sinuses can drain more easily. °· Use a humidifier. °· Inhale steam 3-4 times a day (for example, sit in   the bathroom with the shower running). °· Apply a warm, moist washcloth to your face 3-4 times a day, or as directed by your health care provider. °· Use saline nasal sprays to help moisten and clean your sinuses. °· Take medicines only as directed by your health care  provider. °· If you were prescribed either an antibiotic or antifungal medicine, finish it all even if you start to feel better. °SEEK IMMEDIATE MEDICAL CARE IF: °· You have increasing pain or severe headaches. °· You have nausea, vomiting, or drowsiness. °· You have swelling around your face. °· You have vision problems. °· You have a stiff neck. °· You have difficulty breathing. °  °This information is not intended to replace advice given to you by your health care provider. Make sure you discuss any questions you have with your health care provider. °  °Document Released: 01/26/2005 Document Revised: 02/16/2014 Document Reviewed: 02/10/2011 °[employer] Interactive Patient Education ©2016 [employer] ° °

## 2015-06-03 ENCOUNTER — Encounter: Payer: Self-pay | Admitting: Family Medicine

## 2015-06-03 ENCOUNTER — Ambulatory Visit (INDEPENDENT_AMBULATORY_CARE_PROVIDER_SITE_OTHER): Payer: Medicare Other | Admitting: Family Medicine

## 2015-06-03 VITALS — BP 124/68 | HR 91 | Temp 98.2°F | Resp 18 | Ht 70.0 in | Wt 171.0 lb

## 2015-06-03 DIAGNOSIS — J209 Acute bronchitis, unspecified: Secondary | ICD-10-CM

## 2015-06-03 MED ORDER — BENZONATATE 100 MG PO CAPS: 100.0000 mg | ORAL_CAPSULE | Freq: Three times a day (TID) | ORAL | Status: DC | PRN

## 2015-06-03 MED ORDER — PREDNISONE 10 MG (21) PO TBPK: 10.0000 mg | ORAL_TABLET | Freq: Every day | ORAL | Status: DC

## 2015-06-03 NOTE — Progress Notes (Signed)
Name: Maris BergerDaniel L Hoefer   MRN: [id number]    DOB: 02-Feb-1946   Date:06/03/2015       Progress Note  Subjective  Chief Complaint  Chief Complaint  Patient presents with  . Follow-up    Bronchitis    HPI  Pt. Presents for evaluation of bronchitis/sinus infection. Started 12 days ago, initial symptoms were nasal congestion and cough. [facility] and started on Augmentin, and Tessalon Perles. Pt. Feels much better, cough and some congestion is still there, no fevers.   Past Medical History  Diagnosis Date  . Hyperlipidemia   . Hypertension     Past Surgical History  Procedure Laterality Date  . No past surgeries      Family History  Problem Relation Age of Onset  . Diabetes Mother     Social History   Social History  . Marital Status: Married    Spouse Name: N/A  . Number of Children: N/A  . Years of Education: N/A   Occupational History  . Not on file.   Social History Main Topics  . Smoking status: Never Smoker   . Smokeless tobacco: Not on file  . Alcohol Use: No  . Drug Use: No  . Sexual Activity: Not on file   Other Topics Concern  . Not on file   Social History Narrative     Current outpatient prescriptions:  .  amoxicillin-clavulanate (AUGMENTIN) 875-125 MG tablet, Take 1 tablet by mouth every 12 (twelve) hours., Disp: 20 tablet, Rfl: 0 .  aspirin 81 MG tablet, Take 81 mg by mouth daily., Disp: , Rfl:  .  atorvastatin (LIPITOR) 40 MG tablet, Take 0.5 tablets (20 mg total) by mouth daily., Disp: 30 tablet, Rfl: 3 .  benazepril-hydrochlorthiazide (LOTENSIN HCT) 10-12.5 MG tablet, 1 Tablet, Oral, daily, Disp: 90 tablet, Rfl: 0 .  loratadine (CLARITIN) 10 MG tablet, Take 1 tablet (10 mg total) by mouth daily., Disp: 14 tablet, Rfl: 0 .  metoprolol succinate (TOPROL-XL) 25 MG 24 hr tablet, 1 Tablet ER 24HR, Oral, daily, Disp: 90 tablet, Rfl: 0 .  benzonatate (TESSALON PERLES) 100 MG capsule, Take 1 capsule (100 mg total) by mouth 3  (three) times daily as needed for cough. (Patient not taking: Reported on 06/03/2015), Disp: 15 capsule, Rfl: 0  No Known Allergies   Review of Systems  Constitutional: Negative for fever and chills.  HENT: Positive for congestion.   Respiratory: Positive for cough. Negative for shortness of breath and wheezing.   Cardiovascular: Negative for chest pain.    Objective  Filed Vitals:   06/03/15 1108  BP: 124/68  Pulse: 91  Temp: 98.2 F (36.8 C)  TempSrc: Oral  Resp: 18  Height: 5\' 10"  (1.778 m)  Weight: 171 lb (77.565 kg)  SpO2: 95%    Physical Exam  Constitutional: He is oriented to person, place, and time and well-developed, well-nourished, and in no distress.  HENT:  Head: Normocephalic and atraumatic.  Nose: Right sinus exhibits no maxillary sinus tenderness and no frontal sinus tenderness. Left sinus exhibits no maxillary sinus tenderness and no frontal sinus tenderness.  Mouth/Throat: Posterior oropharyngeal erythema present. No oropharyngeal exudate.  Cardiovascular: Normal rate, regular rhythm, S1 normal and S2 normal.   Pulmonary/Chest: He has wheezes in the left middle field and the left lower field.  Expiratory wheezing in the left lung, no crackles  Neurological: He is alert and oriented to person, place, and time.  Nursing note and vitals reviewed.  Assessment & Plan  1. Acute bronchitis, unspecified organism Patient is to continue and finish the antibiotic therapy prescribed by the urgent care, will start on tapering course of prednisone and refill Tessalon. Symptoms should improve within the next 2-3 days. - predniSONE (STERAPRED UNI-PAK 21 TAB) 10 MG (21) TBPK tablet; Take 1 tablet (10 mg total) by mouth daily. 60 50 40 then STOP  Dispense: 21 tablet; Refill: 0 - benzonatate (TESSALON PERLES) 100 MG capsule; Take 1 capsule (100 mg total) by mouth 3 (three) times daily as needed for cough.  Dispense: 15 capsule; Refill: 0   Ashea Winiarski Asad A.  [facility] 06/03/2015 11:16 AM

## 2015-06-24 ENCOUNTER — Encounter: Payer: Medicare Other | Admitting: Family Medicine

## 2015-07-16 ENCOUNTER — Other Ambulatory Visit: Payer: Self-pay | Admitting: Family Medicine

## 2015-07-18 ENCOUNTER — Ambulatory Visit (INDEPENDENT_AMBULATORY_CARE_PROVIDER_SITE_OTHER): Payer: Medicare Other | Admitting: Family Medicine

## 2015-07-18 ENCOUNTER — Encounter: Payer: Self-pay | Admitting: Family Medicine

## 2015-07-18 VITALS — BP 118/80 | HR 89 | Temp 98.6°F | Resp 18 | Ht 70.0 in | Wt 165.2 lb

## 2015-07-18 DIAGNOSIS — Z86711 Personal history of pulmonary embolism: Secondary | ICD-10-CM | POA: Insufficient documentation

## 2015-07-18 DIAGNOSIS — I10 Essential (primary) hypertension: Secondary | ICD-10-CM

## 2015-07-18 DIAGNOSIS — E785 Hyperlipidemia, unspecified: Secondary | ICD-10-CM | POA: Diagnosis not present

## 2015-07-18 DIAGNOSIS — R739 Hyperglycemia, unspecified: Secondary | ICD-10-CM | POA: Diagnosis not present

## 2015-07-18 MED ORDER — ATORVASTATIN CALCIUM 40 MG PO TABS: 20.0000 mg | ORAL_TABLET | ORAL | Status: DC

## 2015-07-18 MED ORDER — LORATADINE 10 MG PO TABS: 10.0000 mg | ORAL_TABLET | Freq: Every day | ORAL | Status: DC | PRN

## 2015-07-18 MED ORDER — BENAZEPRIL-HYDROCHLOROTHIAZIDE 10-12.5 MG PO TABS: 1.0000 | ORAL_TABLET | Freq: Every day | ORAL | Status: DC

## 2015-07-18 MED ORDER — METOPROLOL SUCCINATE ER 25 MG PO TB24: 25.0000 mg | ORAL_TABLET | Freq: Every day | ORAL | Status: DC

## 2015-07-18 MED ORDER — ATORVASTATIN CALCIUM 40 MG PO TABS: 40.0000 mg | ORAL_TABLET | ORAL | Status: DC

## 2015-07-18 NOTE — Progress Notes (Signed)
Date:  07/18/2015   Name:  Kevin Acosta   DOB:  1946-01-14   MRN:  [id number]  PCP:  Dennison MascotLemont Morrisey, MD    Chief Complaint: Medication Refill   History of Present Illness:  This is a 70 y.o. male seen in six month f/u. On asa s/p PE years ago. Needs refill metoprolol, Lotensin HCT, and Lipitor. No labs since December, hyperglycemia noted then.  Review of Systems:  Review of Systems  Constitutional: Negative for fever and fatigue.  Respiratory: Negative for cough and shortness of breath.   Cardiovascular: Negative for chest pain and leg swelling.  Endocrine: Negative for polyuria.  Genitourinary: Negative for difficulty urinating.  Neurological: Negative for syncope and light-headedness.    Patient Active Problem List   Diagnosis Date Noted  . Acute bronchitis 06/03/2015  . Hyperlipidemia 08/15/2014  . Essential hypertension 08/15/2014  . Arthritis 08/15/2014  . Synovial cyst 08/15/2014    Prior to Admission medications   Medication Sig Start Date End Date Taking? Authorizing Provider  aspirin 81 MG tablet Take 81 mg by mouth daily.    Historical Provider, MD  atorvastatin (LIPITOR) 40 MG tablet Take 1 tablet (40 mg total) by mouth every other day. 07/18/15   Schuyler AmorWilliam Rolena Knutson, MD  benazepril-hydrochlorthiazide (LOTENSIN HCT) 10-12.5 MG tablet Take 1 tablet by mouth daily. 07/18/15   Schuyler AmorWilliam Javarri Segal, MD  loratadine (CLARITIN) 10 MG tablet Take 1 tablet (10 mg total) by mouth daily as needed for allergies. 07/18/15   Schuyler AmorWilliam Evelynne Spiers, MD  metoprolol succinate (TOPROL-XL) 25 MG 24 hr tablet Take 1 tablet (25 mg total) by mouth daily. 07/18/15   Schuyler AmorWilliam Oliviana Mcgahee, MD    No Known Allergies  Past Surgical History  Procedure Laterality Date  . No past surgeries      Social History  Substance Use Topics  . Smoking status: Never Smoker   . Smokeless tobacco: None  . Alcohol Use: No    Family History  Problem Relation Age of Onset  . Diabetes Mother     Medication list has been  reviewed and updated.  Physical Examination: BP 118/80 mmHg  Pulse 89  Temp(Src) 98.6 F (37 C)  Resp 18  Ht 5\' 10"  (1.778 m)  Wt 165 lb 4 oz (74.957 kg)  BMI 23.71 kg/m2  SpO2 98%  Physical Exam  Constitutional: He appears well-developed and well-nourished.  Cardiovascular: Normal rate, regular rhythm and normal heart sounds.   Pulmonary/Chest: Effort normal and breath sounds normal.  Musculoskeletal: He exhibits no edema.  Neurological: He is alert.  Skin: Skin is warm and dry.  Psychiatric: He has a normal mood and affect. His behavior is normal.  Nursing note and vitals reviewed.   Assessment and Plan:  1. Essential hypertension Well controlled, refill metoprolol/Lotensin HCT - Comprehensive Metabolic Panel (CMET)  2. Hyperlipidemia Well controlled on Lipitor - Lipid Profile  3. Hyperglycemia - HgB A1c  4. Hx pulmonary embolism Cont asa  Return in about 6 months (around 01/17/2016).  Kevin AnoWilliam M. Kingsley SpittlePlonk, Jr. MD [facility]  07/18/2015

## 2015-07-19 ENCOUNTER — Encounter: Payer: Self-pay | Admitting: Family Medicine

## 2015-07-19 DIAGNOSIS — R7303 Prediabetes: Secondary | ICD-10-CM | POA: Insufficient documentation

## 2015-07-19 LAB — COMPREHENSIVE METABOLIC PANEL
A/G RATIO: 1.5 (ref 1.2–2.2)
ALT: 30 IU/L (ref 0–44)
AST: 27 IU/L (ref 0–40)
Albumin: 4.7 g/dL (ref 3.5–4.8)
Alkaline Phosphatase: 83 IU/L (ref 39–117)
BUN/Creatinine Ratio: 22 (ref 10–24)
BUN: 21 mg/dL (ref 8–27)
Bilirubin Total: 1.1 mg/dL (ref 0.0–1.2)
CALCIUM: 10 mg/dL (ref 8.6–10.2)
CO2: 26 mmol/L (ref 18–29)
CREATININE: 0.94 mg/dL (ref 0.76–1.27)
Chloride: 96 mmol/L (ref 96–106)
GFR, EST AFRICAN AMERICAN: 95 mL/min/{1.73_m2} (ref >59)
GFR, EST NON AFRICAN AMERICAN: 82 mL/min/{1.73_m2} (ref >59)
GLOBULIN, TOTAL: 3.2 g/dL (ref 1.5–4.5)
Glucose: 123 mg/dL — ABNORMAL HIGH (ref 65–99)
Potassium: 4.8 mmol/L (ref 3.5–5.2)
Sodium: 138 mmol/L (ref 134–144)
TOTAL PROTEIN: 7.9 g/dL (ref 6.0–8.5)

## 2015-07-19 LAB — LIPID PANEL
CHOLESTEROL TOTAL: 174 mg/dL (ref 100–199)
Chol/HDL Ratio: 3.4 ratio units (ref 0.0–5.0)
HDL: 51 mg/dL (ref >39)
LDL Calculated: 107 mg/dL — ABNORMAL HIGH (ref 0–99)
Triglycerides: 80 mg/dL (ref 0–149)
VLDL CHOLESTEROL CAL: 16 mg/dL (ref 5–40)

## 2015-07-19 LAB — HEMOGLOBIN A1C
ESTIMATED AVERAGE GLUCOSE: 126 mg/dL
HEMOGLOBIN A1C: 6 % — AB (ref 4.8–5.6)

## 2018-07-02 ENCOUNTER — Encounter: Payer: Self-pay | Admitting: Emergency Medicine

## 2018-07-02 ENCOUNTER — Observation Stay: Payer: Medicare Other

## 2018-07-02 ENCOUNTER — Other Ambulatory Visit: Payer: Self-pay

## 2018-07-02 ENCOUNTER — Ambulatory Visit (INDEPENDENT_AMBULATORY_CARE_PROVIDER_SITE_OTHER)
Admission: EM | Admit: 2018-07-02 | Discharge: 2018-07-02 | Disposition: A | Discharge status: Nursing Home (Includes ICF) | Payer: Medicare Other | Source: Home / Self Care

## 2018-07-02 ENCOUNTER — Encounter
Admission: EM | Disposition: A | Discharge status: Home / Self Care | Payer: Self-pay | Source: Home / Self Care | Attending: Internal Medicine

## 2018-07-02 ENCOUNTER — Inpatient Hospital Stay (HOSPITAL_COMMUNITY)
Admission: AD | Admit: 2018-07-02 | Payer: Medicare Other | Source: Other Acute Inpatient Hospital | Admitting: Internal Medicine

## 2018-07-02 ENCOUNTER — Inpatient Hospital Stay
Admission: EM | Admit: 2018-07-02 | Discharge: 2018-07-04 | DRG: 982 | Disposition: A | Discharge status: Home / Self Care | Payer: Medicare Other | Attending: Internal Medicine | Admitting: Internal Medicine

## 2018-07-02 DIAGNOSIS — K625 Hemorrhage of anus and rectum: Secondary | ICD-10-CM | POA: Diagnosis not present

## 2018-07-02 DIAGNOSIS — Z91041 Radiographic dye allergy status: Secondary | ICD-10-CM

## 2018-07-02 DIAGNOSIS — R7303 Prediabetes: Secondary | ICD-10-CM | POA: Diagnosis present

## 2018-07-02 DIAGNOSIS — Z801 Family history of malignant neoplasm of trachea, bronchus and lung: Secondary | ICD-10-CM

## 2018-07-02 DIAGNOSIS — D62 Acute posthemorrhagic anemia: Secondary | ICD-10-CM

## 2018-07-02 DIAGNOSIS — Z833 Family history of diabetes mellitus: Secondary | ICD-10-CM

## 2018-07-02 DIAGNOSIS — E785 Hyperlipidemia, unspecified: Secondary | ICD-10-CM | POA: Diagnosis not present

## 2018-07-02 DIAGNOSIS — I10 Essential (primary) hypertension: Secondary | ICD-10-CM | POA: Diagnosis not present

## 2018-07-02 DIAGNOSIS — K922 Gastrointestinal hemorrhage, unspecified: Secondary | ICD-10-CM | POA: Diagnosis not present

## 2018-07-02 DIAGNOSIS — Z7982 Long term (current) use of aspirin: Secondary | ICD-10-CM

## 2018-07-02 DIAGNOSIS — K5731 Diverticulosis of large intestine without perforation or abscess with bleeding: Secondary | ICD-10-CM | POA: Diagnosis not present

## 2018-07-02 DIAGNOSIS — Z20828 Contact with and (suspected) exposure to other viral communicable diseases: Secondary | ICD-10-CM | POA: Diagnosis present

## 2018-07-02 DIAGNOSIS — Z79899 Other long term (current) drug therapy: Secondary | ICD-10-CM

## 2018-07-02 DIAGNOSIS — Z86711 Personal history of pulmonary embolism: Secondary | ICD-10-CM

## 2018-07-02 HISTORY — PX: VISCERAL ANGIOGRAPHY: CATH118276

## 2018-07-02 LAB — COMPREHENSIVE METABOLIC PANEL
ALT: 21 U/L (ref 0–44)
AST: 16 U/L (ref 15–41)
Albumin: 4 g/dL (ref 3.5–5.0)
Alkaline Phosphatase: 61 U/L (ref 38–126)
Anion gap: 11 (ref 5–15)
BUN: 19 mg/dL (ref 8–23)
CO2: 25 mmol/L (ref 22–32)
Calcium: 9.1 mg/dL (ref 8.9–10.3)
Chloride: 101 mmol/L (ref 98–111)
Creatinine, Ser: 0.7 mg/dL (ref 0.61–1.24)
GFR calc Af Amer: >60 mL/min (ref >60)
GFR calc non Af Amer: >60 mL/min (ref >60)
Glucose, Bld: 158 mg/dL — ABNORMAL HIGH (ref 70–99)
Potassium: 3.8 mmol/L (ref 3.5–5.1)
Sodium: 137 mmol/L (ref 135–145)
Total Bilirubin: 0.7 mg/dL (ref 0.3–1.2)
Total Protein: 6.9 g/dL (ref 6.5–8.1)

## 2018-07-02 LAB — CBC
HCT: 43.7 % (ref 39.0–52.0)
Hemoglobin: 14.4 g/dL (ref 13.0–17.0)
MCH: 30.1 pg (ref 26.0–34.0)
MCHC: 33 g/dL (ref 30.0–36.0)
MCV: 91.2 fL (ref 80.0–100.0)
Platelets: 188 10*3/uL (ref 150–400)
RBC: 4.79 MIL/uL (ref 4.22–5.81)
RDW: 12.7 % (ref 11.5–15.5)
WBC: 11.3 10*3/uL — ABNORMAL HIGH (ref 4.0–10.5)
nRBC: 0 % (ref 0.0–0.2)

## 2018-07-02 LAB — HEMOGLOBIN: Hemoglobin: 12.4 g/dL — ABNORMAL LOW (ref 13.0–17.0)

## 2018-07-02 LAB — SARS CORONAVIRUS 2 BY RT PCR (HOSPITAL ORDER, PERFORMED IN ~~LOC~~ HOSPITAL LAB): SARS Coronavirus 2: NEGATIVE

## 2018-07-02 LAB — TYPE AND SCREEN
ABO/RH(D): A POS
Antibody Screen: NEGATIVE

## 2018-07-02 SURGERY — VISCERAL ANGIOGRAPHY
Anesthesia: Moderate Sedation

## 2018-07-02 MED ORDER — SODIUM CHLORIDE 0.9 % IV SOLN
INTRAVENOUS | Status: DC
  Administered 2018-07-02 – 2018-07-03 (×2): via INTRAVENOUS

## 2018-07-02 MED ORDER — MIDAZOLAM HCL 2 MG/2ML IJ SOLN
INTRAMUSCULAR | Status: DC | PRN
  Administered 2018-07-02: 2 mg via INTRAVENOUS
  Administered 2018-07-03: 02:00:00

## 2018-07-02 MED ORDER — TECHNETIUM TC 99M-LABELED RED BLOOD CELLS IV KIT
20.0000 | PACK | Freq: Once | INTRAVENOUS | Status: AC | PRN
  Administered 2018-07-02: 22.7 via INTRAVENOUS

## 2018-07-02 MED ORDER — METHYLPREDNISOLONE SODIUM SUCC 125 MG IJ SOLR
INTRAMUSCULAR | Status: AC
  Administered 2018-07-02: 25 mg
  Filled 2018-07-02: qty 2

## 2018-07-02 MED ORDER — IOHEXOL 300 MG/ML  SOLN
INTRAMUSCULAR | Status: DC | PRN
  Administered 2018-07-02: 40 mL via INTRAVENOUS

## 2018-07-02 MED ORDER — DIPHENHYDRAMINE HCL 50 MG/ML IJ SOLN
INTRAMUSCULAR | Status: DC | PRN
  Administered 2018-07-02: 25 mg via INTRAVENOUS
  Administered 2018-07-03: 02:00:00

## 2018-07-02 MED ORDER — PHENYLEPHRINE HCL (PRESSORS) 10 MG/ML IV SOLN
INTRAVENOUS | Status: AC
  Filled 2018-07-02: qty 1

## 2018-07-02 MED ORDER — DIPHENHYDRAMINE HCL 50 MG/ML IJ SOLN
INTRAMUSCULAR | Status: AC
  Administered 2018-07-03: 02:00:00
  Filled 2018-07-02: qty 1

## 2018-07-02 MED ORDER — FENTANYL CITRATE (PF) 100 MCG/2ML IJ SOLN
INTRAMUSCULAR | Status: AC
  Filled 2018-07-02: qty 2

## 2018-07-02 MED ORDER — ATROPINE SULFATE 1 MG/10ML IJ SOSY
PREFILLED_SYRINGE | INTRAMUSCULAR | Status: AC
  Filled 2018-07-02: qty 10

## 2018-07-02 MED ORDER — MIDAZOLAM HCL 5 MG/5ML IJ SOLN
INTRAMUSCULAR | Status: AC
  Administered 2018-07-03: 02:00:00
  Filled 2018-07-02: qty 5

## 2018-07-02 MED ORDER — ONDANSETRON HCL 4 MG/2ML IJ SOLN: 4.0000 mg | Freq: Four times a day (QID) | INTRAMUSCULAR | Status: DC | PRN

## 2018-07-02 MED ORDER — ONDANSETRON HCL 4 MG PO TABS: 4.0000 mg | ORAL_TABLET | Freq: Four times a day (QID) | ORAL | Status: DC | PRN

## 2018-07-02 MED ORDER — SODIUM CHLORIDE 0.9 % IV SOLN: INTRAVENOUS | Status: DC

## 2018-07-02 MED ORDER — CEFAZOLIN SODIUM-DEXTROSE 2-4 GM/100ML-% IV SOLN
2.0000 g | INTRAVENOUS | Status: AC
  Administered 2018-07-02: 23:00:00 2 g via INTRAVENOUS

## 2018-07-02 MED ORDER — ACETAMINOPHEN 325 MG PO TABS: 650.0000 mg | ORAL_TABLET | Freq: Four times a day (QID) | ORAL | Status: DC | PRN

## 2018-07-02 MED ORDER — ACETAMINOPHEN 650 MG RE SUPP: 650.0000 mg | Freq: Four times a day (QID) | RECTAL | Status: DC | PRN

## 2018-07-02 MED ORDER — CEFAZOLIN SODIUM-DEXTROSE 2-4 GM/100ML-% IV SOLN
INTRAVENOUS | Status: AC
  Administered 2018-07-02: 23:00:00 2 g via INTRAVENOUS
  Filled 2018-07-02: qty 100

## 2018-07-02 SURGICAL SUPPLY — 12 items
BLOCK BEAD 500-700 (Vascular Products) ×3 IMPLANT
CATH MICROCATH PRGRT 2.8F 110 (CATHETERS) ×1 IMPLANT
CATH PIG 70CM (CATHETERS) ×3 IMPLANT
CATH VS15FR (CATHETERS) ×3 IMPLANT
DEVICE STARCLOSE SE CLOSURE (Vascular Products) ×3 IMPLANT
GLIDEWIRE STIFF .35X180X3 HYDR (WIRE) ×3 IMPLANT
MICROCATH PROGREAT 2.8F 110 CM (CATHETERS) ×3
PACK ANGIOGRAPHY (CUSTOM PROCEDURE TRAY) ×3 IMPLANT
SHEATH BRITE TIP 5FRX11 (SHEATH) ×3 IMPLANT
SYR MEDRAD MARK 7 150ML (SYRINGE) ×3 IMPLANT
TUBING CONTRAST HIGH PRESS 72 (TUBING) ×3 IMPLANT
WIRE J 3MM .035X145CM (WIRE) ×3 IMPLANT

## 2018-07-02 NOTE — ED Notes (Signed)
GI at bedside. Patient awaiting admission orders and bed status.

## 2018-07-02 NOTE — ED Notes (Signed)
Bed status updated 1st attempt to call report

## 2018-07-02 NOTE — ED Notes (Signed)
[facility] at bedside to complete bleeding scan. Patient to go up to unit after scan completed. Report given to [facility].

## 2018-07-02 NOTE — ED Notes (Signed)
Admitting Md at bedside. Patient swabbed for Covid. Awaiting results and bed status.

## 2018-07-02 NOTE — Discharge Instructions (Signed)
Go directly to emergency room.  

## 2018-07-02 NOTE — ED Notes (Signed)
Orthostatics obtained, Md at bedside.

## 2018-07-02 NOTE — ED Notes (Signed)
ED TO INPATIENT HANDOFF REPORT  ED Nurse Name and Phone #:    S Name/Age/Gender Kevin Acosta 73 y.o. male Room/Bed: ED13A/ED13A  Code Status   Code Status: Not on file  Home/SNF/Other Home Patient oriented to: self, place, time and situation Is this baseline? Yes   Triage Complete: Triage complete  Chief Complaint rectal bleeding  Triage Note Pt to ED with c/o of 4 episodes of bloody diarrhea that occurred this morning. Pt seen at [facility] and referred to ED.    Allergies No Known Allergies  Level of Care/Admitting Diagnosis ED Disposition    ED Disposition Condition Comment   Admit  Hospital Area: [facility] [[id number]]  Level of Care: Med-Surg [16]  Covid Evaluation: Person Under Investigation (PUI)  Isolation Risk Level: Low Risk/Droplet (Less than 4L [place] supplementation)  Diagnosis: GI bleed [914782]  Admitting Physician: Houston Siren [[id number]]  Attending Physician: Houston Siren [[id number]]  PT Class (Do Not Modify): Observation [104]  PT Acc Code (Do Not Modify): Observation [10022]       B Medical/Surgery History Past Medical History:  Diagnosis Date  . Hyperlipidemia   . Hypertension    Past Surgical History:  Procedure Laterality Date  . NO PAST SURGERIES       A IV Location/Drains/Wounds Patient Lines/Drains/Airways Status   Active Line/Drains/Airways    None          Intake/Output Last 24 hours No intake or output data in the 24 hours ending 07/02/18 1611  Labs/Imaging Results for orders placed or performed during the hospital encounter of 07/02/18 (from the past 48 hour(s))  Comprehensive metabolic panel     Status: Abnormal   Collection Time: 07/02/18 12:27 PM  Result Value Ref Range   Sodium 137 135 - 145 mmol/L   Potassium 3.8 3.5 - 5.1 mmol/L   Chloride 101 98 - 111 mmol/L   CO2 25 22 - 32 mmol/L   Glucose, Bld 158 (H) 70 - 99 mg/dL   BUN 19 8 - 23 mg/dL   Creatinine, Ser 4.69 0.61 -  1.24 mg/dL   Calcium 9.1 8.9 - 62.9 mg/dL   Total Protein 6.9 6.5 - 8.1 g/dL   Albumin 4.0 3.5 - 5.0 g/dL   AST 16 15 - 41 U/L   ALT 21 0 - 44 U/L   Alkaline Phosphatase 61 38 - 126 U/L   Total Bilirubin 0.7 0.3 - 1.2 mg/dL   GFR calc non Af Amer >60 >60 mL/min   GFR calc Af Amer >60 >60 mL/min   Anion gap 11 5 - 15    Comment: Performed at [facility], [address] [place], [place] [address]  CBC     Status: Abnormal   Collection Time: 07/02/18 12:27 PM  Result Value Ref Range   WBC 11.3 (H) 4.0 - 10.5 K/uL   RBC 4.79 4.22 - 5.81 MIL/uL   Hemoglobin 14.4 13.0 - 17.0 g/dL   HCT 32.4 40.1 - 02.7 %   MCV 91.2 80.0 - 100.0 fL   MCH 30.1 26.0 - 34.0 pg   MCHC 33.0 30.0 - 36.0 g/dL   RDW 25.3 66.4 - 40.3 %   Platelets 188 150 - 400 K/uL   nRBC 0.0 0.0 - 0.2 %    Comment: Performed at [facility], [address] [place], [place] [address]  Type and screen [facility]     Status: None   Collection  Time: 07/02/18  1:57 PM  Result Value Ref Range   ABO/RH(D) A POS    Antibody Screen NEG    Sample Expiration      07/05/2018,2359 Performed at [facility], [address] [place], [place] [address]   SARS Coronavirus 2 (CEPHEID - Performed in [facility] hospital lab), Hosp Order     Status: None   Collection Time: 07/02/18  2:39 PM  Result Value Ref Range   SARS Coronavirus 2 NEGATIVE NEGATIVE    Comment: (NOTE) If result is NEGATIVE SARS-CoV-2 target nucleic acids are NOT DETECTED. The SARS-CoV-2 RNA is generally detectable in upper and lower  respiratory specimens during the acute phase of infection. The lowest  concentration of SARS-CoV-2 viral copies this assay can detect is 250  copies / mL. A negative result does not preclude SARS-CoV-2 infection  and should not be used as the sole basis for treatment or other  patient management decisions.  A negative result may occur with  improper specimen collection / handling,  submission of specimen other  than nasopharyngeal swab, presence of viral mutation(s) within the  areas targeted by this assay, and inadequate number of viral copies  (<250 copies / mL). A negative result must be combined with clinical  observations, patient history, and epidemiological information. If result is POSITIVE SARS-CoV-2 target nucleic acids are DETECTED. The SARS-CoV-2 RNA is generally detectable in upper and lower  respiratory specimens dur ing the acute phase of infection.  Positive  results are indicative of active infection with SARS-CoV-2.  Clinical  correlation with patient history and other diagnostic information is  necessary to determine patient infection status.  Positive results do  not rule out bacterial infection or co-infection with other viruses. If result is PRESUMPTIVE POSTIVE SARS-CoV-2 nucleic acids MAY BE PRESENT.   A presumptive positive result was obtained on the submitted specimen  and confirmed on repeat testing.  While 2019 novel coronavirus  (SARS-CoV-2) nucleic acids may be present in the submitted sample  additional confirmatory testing may be necessary for epidemiological  and / or clinical management purposes  to differentiate between  SARS-CoV-2 and other Sarbecovirus currently known to infect humans.  If clinically indicated additional testing with an alternate test  methodology [phone]([id number]) is advised. The SARS-CoV-2 RNA is generally  detectable in upper and lower respiratory sp ecimens during the acute  phase of infection. The expected result is Negative. Fact Sheet for Patients:  BoilerBrush.com.cyhttps://www.fda.gov/media/136312/download Fact Sheet for Healthcare Providers: https://pope.com/https://www.fda.gov/media/136313/download This test is not yet approved or cleared by the [place] FDA and has been authorized for detection and/or diagnosis of SARS-CoV-2 by FDA under an Emergency Use Authorization (EUA).  This EUA will remain in effect (meaning this test can be  used) for the duration of the COVID-19 declaration under Section 564(b)(1) of the Act, 21 U.S.C. section 360bbb-3(b)(1), unless the authorization is terminated or revoked sooner. Performed at [facility], [address] [place], [place] [address]    No results found.  Pending Labs [employer] Labs (From admission, onward)    Start     Ordered   Signed and Armed forces training and education officerHeld  Basic metabolic panel  Tomorrow morning,   R     Signed and Held   Signed and Held  CBC  Tomorrow morning,   R     Signed and Held          Vitals/Pain Today's Vitals   07/02/18 1400 07/02/18 1415 07/02/18 1430 07/02/18 1438  BP: (!) 170/88  134/90 134/90  Pulse: (!) 111 99 98 98  Resp:    17  Temp:      TempSrc:      SpO2: 99% 100% 99% 99%  PainSc:    0-No pain    Isolation Precautions No active isolations  Medications Medications - No data to display  Mobility walks Low fall risk   Focused Assessments Pulmonary Assessment Handoff:  Lung sounds:   O2 Device: Room Air     ,     R Recommendations: See Admitting Provider Note  Report given to:   Additional Notes:

## 2018-07-02 NOTE — H&P (Signed)
[employer] [facility] [place] [facility]    PATIENT NAME: Kevin Acosta    MR#:  [id number]  DATE OF BIRTH:  [birth year]  DATE OF ADMISSION:  07/02/2018  PRIMARY CARE PHYSICIAN: [facility]   REQUESTING/REFERRING PHYSICIAN: Dr. Daryel NovemberJonathan Williams.   CHIEF COMPLAINT:   Chief Complaint  Patient presents with  . Rectal Bleeding    HISTORY OF PRESENT ILLNESS:  Kevin Acosta  is a 73 y.o. male with a known history of hypertension, hyperlipidemia who presents to the hospital due to multiple episodes of rectal bleeding.  Patient says he was in his usual state of health and developed rectal bleeding early this morning and he has had 5 episodes of it.  Patient describes it as liquid red blood with minimal brown stool associated with it.  He denies any abdominal pain nausea vomiting fever chills or any other associated symptoms.  Patient says he is never had these symptoms prior in the past.  Patient has no previous history of diverticulosis but does have a history of hemorrhoids.  Given his ongoing rectal bleeding and him being slightly orthostatic in ER hospitalist services were contacted for admission.  PAST MEDICAL HISTORY:   Past Medical History:  Diagnosis Date  . Hyperlipidemia   . Hypertension     PAST SURGICAL HISTORY:   Past Surgical History:  Procedure Laterality Date  . NO PAST SURGERIES      SOCIAL HISTORY:   Social History   Tobacco Use  . Smoking status: Never Smoker  . Smokeless tobacco: Never Used  Substance Use Topics  . Alcohol use: No    Alcohol/week: 0.0 standard drinks    FAMILY HISTORY:   Family History  Problem Relation Age of Onset  . Diabetes Mother   . Lung cancer Father     DRUG ALLERGIES:  No Known Allergies  REVIEW OF SYSTEMS:   Review of Systems  Constitutional: Negative for fever and weight loss.  HENT: Negative for congestion, nosebleeds and tinnitus.   Eyes: Negative for blurred vision, double vision and  redness.  Respiratory: Negative for cough, hemoptysis and shortness of breath.   Cardiovascular: Negative for chest pain, orthopnea, leg swelling and PND.  Gastrointestinal: Positive for blood in stool. Negative for abdominal pain, diarrhea, melena, nausea and vomiting.  Genitourinary: Negative for dysuria, hematuria and urgency.  Musculoskeletal: Negative for falls and joint pain.  Neurological: Negative for dizziness, tingling, sensory change, focal weakness, seizures, weakness and headaches.  Endo/Heme/Allergies: Negative for polydipsia. Does not bruise/bleed easily.  Psychiatric/Behavioral: Negative for depression and memory loss. The patient is not nervous/anxious.     MEDICATIONS AT HOME:   Prior to Admission medications   Medication Sig Start Date End Date Taking? Authorizing Provider  aspirin 81 MG tablet Take 81 mg by mouth daily.   Yes [provider]  Multiple Vitamins-Minerals (CENTRUM SILVER 50+MEN) TABS Take 1 tablet by mouth daily.   Yes [provider]      VITAL SIGNS:  Blood pressure 134/90, pulse 98, temperature 98.1 F (36.7 C), temperature source Oral, resp. rate 17, SpO2 99 %.  PHYSICAL EXAMINATION:  Physical Exam  GENERAL:  73 y.o.-year-old patient lying in the bed in no acute distress.  EYES: Pupils equal, round, reactive to light and accommodation. No scleral icterus. Extraocular muscles intact.  HEENT: Head atraumatic, normocephalic. Oropharynx and nasopharynx clear. No oropharyngeal erythema, moist oral mucosa  NECK:  Supple, no jugular venous distention. No thyroid enlargement, no tenderness.  LUNGS: Normal breath  sounds bilaterally, no wheezing, rales, rhonchi. No use of accessory muscles of respiration.  CARDIOVASCULAR: S1, S2 RRR. No murmurs, rubs, gallops, clicks.  ABDOMEN: Soft, nontender, nondistended. Bowel sounds present. No organomegaly or mass.  EXTREMITIES: No pedal edema, cyanosis, or clubbing. + 2 pedal & radial pulses b/l.    NEUROLOGIC: Cranial nerves II through XII are intact. No focal Motor or sensory deficits appreciated b/l PSYCHIATRIC: The patient is alert and oriented x 3.   SKIN: No obvious rash, lesion, or ulcer.   LABORATORY PANEL:   CBC Recent Labs  Lab 07/02/18 1227  WBC 11.3*  HGB 14.4  HCT 43.7  PLT 188   ------------------------------------------------------------------------------------------------------------------  Chemistries  Recent Labs  Lab 07/02/18 1227  NA 137  K 3.8  CL 101  CO2 25  GLUCOSE 158*  BUN 19  CREATININE 0.70  CALCIUM 9.1  AST 16  ALT 21  ALKPHOS 61  BILITOT 0.7   ------------------------------------------------------------------------------------------------------------------  Cardiac Enzymes No results for input(s): TROPONINI in the last 168 hours. ------------------------------------------------------------------------------------------------------------------  RADIOLOGY:  No results found.   IMPRESSION AND PLAN:   73 year old male with past medical history of hypertension, hyperlipidemia who presents to the hospital due to rectal bleeding.  1.  GI bleed-suspected to be a lower GI bleed given the patient's rectal bleeding. - Patient has no previous history of diverticulosis but does have a history of hemorrhoids. -Hemoglobin currently stable and will continue to monitor.  If patient has brisk bleeding will get a nuclear meeting bleeding scan. -We will stop gastroenterology. -Hold patient's aspirin.  Follow serial hemoglobins.  2.  Essential hypertension- patient is currently not on any antihypertensives. -Follow blood pressure.    All the records are reviewed and case discussed with ED provider. Management plans discussed with the patient, family and they are in agreement.  CODE STATUS: Full code  TOTAL TIME TAKING CARE OF THIS PATIENT: 40 minutes.    Houston Siren M.D on 07/02/2018 at 2:54 PM  Between 7am to 6pm - Pager -  [phone]  After 6pm go to [email or web address] - password [email or web address] [facility]  [facility]  Office  [phone]  CC: Primary care physician; Nira Retort

## 2018-07-02 NOTE — ED Triage Notes (Signed)
Pt to ED with c/o of 4 episodes of bloody diarrhea that occurred this morning. Pt seen at [facility] and referred to ED.

## 2018-07-02 NOTE — Progress Notes (Signed)
Patient was going to be transferred to [facility] for angiogram with embolization but notified by the nurse that Dr. Wyn Quaker is coming in tonight to do the procedure here.  Transfer has been canceled.  Transfer center notified.

## 2018-07-02 NOTE — ED Notes (Signed)
Patient reports BBRB that began this morning, no hx of diverticuli, reports 5 episode of BRRB today, also reports hx of hemorrhoids. Denies dizziness/ pain or any other c/o's.

## 2018-07-02 NOTE — Discharge Summary (Signed)
[facility] - [place] at [facility]   PATIENT NAME: Kevin Acosta    MR#:  [id number]  DATE OF BIRTH:  20-Aug-1945  DATE OF ADMISSION:  07/02/2018 ADMITTING PHYSICIAN: Houston Siren, MD  DATE OF DISCHARGE: 07/02/2018  PRIMARY CARE PHYSICIAN: Nira Retort    ADMISSION DIAGNOSIS:  Rectal bleeding [K62.5]  DISCHARGE DIAGNOSIS:  Active Problems:   GI bleed   SECONDARY DIAGNOSIS:   Past Medical History:  Diagnosis Date  . Hyperlipidemia   . Hypertension     HOSPITAL COURSE:   73 year old male with past medical history of hypertension, hyperlipidemia who presents to the hospital due to rectal bleeding.  1.  GI bleed-suspected to be a lower GI bleed given the patient's rectal bleeding. -Patient admitted to the hospital due to multiple episodes of rectal bleeding.  Seen by gastroenterology underwent a nuclear medicine bleeding scan which was positive in the sigmoid colon. -Patient is being transferred to [facility] for interventional radiology to perform angiogram with intervention due to his brisk GI bleed.  Presently patient is hemodynamically stable. -Stat hemoglobin has been currently ordered and if it significantly low will transfuse some blood.  2.  Essential hypertension- patient is currently not on any antihypertensives. -Hemodynamically stable presently.  DISCHARGE CONDITIONS:   Stable  CONSULTS OBTAINED:  Treatment Team:  Toney Reil, MD  DRUG ALLERGIES:  No Known Allergies  DISCHARGE MEDICATIONS:   Allergies as of 07/02/2018   No Known Allergies     Medication List    STOP taking these medications   aspirin 81 MG tablet     TAKE these medications   Centrum Silver 50+Men Tabs Take 1 tablet by mouth daily.         DISCHARGE INSTRUCTIONS:   DIET:  Clear liquid diet  DISCHARGE CONDITION:  Stable  ACTIVITY:  Activity as tolerated  OXYGEN:  Home Oxygen: No.   Oxygen Delivery: room  air  DISCHARGE LOCATION:  [place].    If you experience worsening of your admission symptoms, develop shortness of breath, life threatening emergency, suicidal or homicidal thoughts you must seek medical attention immediately by calling 911 or calling your MD immediately  if symptoms less severe.  You Must read complete instructions/literature along with all the possible adverse reactions/side effects for all the Medicines you take and that have been prescribed to you. Take any new Medicines after you have completely understood and accpet all the possible adverse reactions/side effects.   Please note  You were cared for by a hospitalist during your hospital stay. If you have any questions about your discharge medications or the care you received while you were in the hospital after you are discharged, you can call the unit and asked to speak with the hospitalist on call if the hospitalist that took care of you is not available. Once you are discharged, your primary care physician will handle any further medical issues. Please note that NO REFILLS for any discharge medications will be authorized once you are discharged, as it is imperative that you return to your primary care physician (or establish a relationship with a primary care physician if you do not have one) for your aftercare needs so that they can reassess your need for medications and monitor your lab values.   DATA REVIEW:   CBC Recent Labs  Lab 07/02/18 1227  WBC 11.3*  HGB 14.4  HCT 43.7  PLT 188    Chemistries  Recent Labs  Lab 07/02/18 1227  NA 137  K 3.8  CL 101  CO2 25  GLUCOSE 158*  BUN 19  CREATININE 0.70  CALCIUM 9.1  AST 16  ALT 21  ALKPHOS 61  BILITOT 0.7    Cardiac Enzymes No results for input(s): TROPONINI in the last 168 hours.  Microbiology Results  Results for orders placed or performed during the hospital encounter of 07/02/18  SARS Coronavirus 2 (CEPHEID - Performed in [facility]  hospital lab), Hosp Order     Status: None   Collection Time: 07/02/18  2:39 PM  Result Value Ref Range Status   SARS Coronavirus 2 NEGATIVE NEGATIVE Final    Comment: (NOTE) If result is NEGATIVE SARS-CoV-2 target nucleic acids are NOT DETECTED. The SARS-CoV-2 RNA is generally detectable in upper and lower  respiratory specimens during the acute phase of infection. The lowest  concentration of SARS-CoV-2 viral copies this assay can detect is 250  copies / mL. A negative result does not preclude SARS-CoV-2 infection  and should not be used as the sole basis for treatment or other  patient management decisions.  A negative result may occur with  improper specimen collection / handling, submission of specimen other  than nasopharyngeal swab, presence of viral mutation(s) within the  areas targeted by this assay, and inadequate number of viral copies  (<250 copies / mL). A negative result must be combined with clinical  observations, patient history, and epidemiological information. If result is POSITIVE SARS-CoV-2 target nucleic acids are DETECTED. The SARS-CoV-2 RNA is generally detectable in upper and lower  respiratory specimens dur ing the acute phase of infection.  Positive  results are indicative of active infection with SARS-CoV-2.  Clinical  correlation with patient history and other diagnostic information is  necessary to determine patient infection status.  Positive results do  not rule out bacterial infection or co-infection with other viruses. If result is PRESUMPTIVE POSTIVE SARS-CoV-2 nucleic acids MAY BE PRESENT.   A presumptive positive result was obtained on the submitted specimen  and confirmed on repeat testing.  While 2019 novel coronavirus  (SARS-CoV-2) nucleic acids may be present in the submitted sample  additional confirmatory testing may be necessary for epidemiological  and / or clinical management purposes  to differentiate between  SARS-CoV-2 and other  Sarbecovirus currently known to infect humans.  If clinically indicated additional testing with an alternate test  methodology [phone]) is advised. The SARS-CoV-2 RNA is generally  detectable in upper and lower respiratory sp ecimens during the acute  phase of infection. The expected result is Negative. Fact Sheet for Patients:  BoilerBrush.com.cy Fact Sheet for Healthcare Providers: https://pope.com/ This test is not yet approved or cleared by the [place] FDA and has been authorized for detection and/or diagnosis of SARS-CoV-2 by FDA under an Emergency Use Authorization (EUA).  This EUA will remain in effect (meaning this test can be used) for the duration of the COVID-19 declaration under Section 564(b)(1) of the Act, 21 U.S.C. section 360bbb-3(b)(1), unless the authorization is terminated or revoked sooner. Performed at [facility], [address] [place], [place] [address]     RADIOLOGY:  Nm Gi Blood Loss  Result Date: 07/02/2018 CLINICAL DATA:  Bright red blood per rectum. EXAM: NUCLEAR MEDICINE GASTROINTESTINAL BLEEDING SCAN TECHNIQUE: Sequential abdominal images were obtained following intravenous administration of Tc-29m labeled red blood cells. RADIOPHARMACEUTICALS:  22.7 mCi Tc-34m pertechnetate in-vitro labeled red cells. COMPARISON:  CT dated 05/22/2005 FINDINGS: Extraluminal accumulation of radiotracer is visualized in the left  lower quadrant and midline high pelvis. There appears to be some reflux of tracer proximally towards the splenic flexure. The study was positive at the 1 hour and 10 minutes mark. No other significant abnormality detected on this examination. There may be some accumulation of radiotracer in the bladder due to the presence of free pertechnetate. IMPRESSION: Positive GI bleed study with accumulation of radiotracer at the level of the sigmoid colon in the left lower quadrant. The study was  positive at about the 1 hour mark. These results will be called to the ordering clinician or representative by the Radiologist Assistant, and communication documented in the PACS or zVision Dashboard. Electronically Signed   By: Katherine Mantlehristopher  Green M.D.   On: 07/02/2018 19:38      Management plans discussed with the patient, family and they are in agreement.  CODE STATUS:     Code Status Orders  (From admission, onward)         Start     Ordered   07/02/18 2014  Full code  Continuous     07/02/18 2013        Code Status History    This patient has a current code status but no historical code status.      TOTAL TIME TAKING CARE OF THIS PATIENT: 45 minutes.    Houston SirenVivek J Babygirl Trager M.D on 07/02/2018 at 9:08 PM  Between 7am to 6pm - Pager - [phone]  After 6pm go to [email or web address] - Scientist, research (life sciences)password EPAS [facility]  [facility] [place] [facility]  Office  [phone]  CC: Primary care physician; [facility]

## 2018-07-02 NOTE — ED Provider Notes (Signed)
[facility] ____________________________________________  Time seen: Approximately 11:48 AM  I have reviewed the triage vital signs and the nursing notes.   HISTORY  Chief Complaint Rectal Bleeding  HPI Kevin Acosta is a 73 y.o. male presenting for evaluation of acute rectal bleeding.  Patient reports starting this morning at 7:30 AM he has had 3 loose bowel movements with bright red blood with wiping as well as feeling toilet bowl.  States the water was too discolored to determine if the stool had blood in it as well.  Last normal bowel movement was yesterday.  States has a history of occasional issues with hemorrhoids with blood with wiping, no recent hemorrhoid issues.  No injury.  Denies accompanying abdominal pain, vomiting, cough, fevers, weakness or dizziness.  Denies history of similar.  Takes aspirin daily.  Denies other changes.  Last colonoscopy was approximately 5 or 6 years ago in which she reports was unremarkable.   Past Medical History:  Diagnosis Date  . Hyperlipidemia   . Hypertension     Patient Active Problem List   Diagnosis Date Noted  . Prediabetes 07/19/2015  . Hx pulmonary embolism 07/18/2015  . Hyperlipidemia 08/15/2014  . Essential hypertension 08/15/2014  . Arthritis 08/15/2014    Past Surgical History:  Procedure Laterality Date  . NO PAST SURGERIES       No current facility-administered medications for this encounter.   Current Outpatient Medications:  .  aspirin 81 MG tablet, Take 81 mg by mouth daily., Disp: , Rfl:  .  atorvastatin (LIPITOR) 40 MG tablet, Take 1 tablet (40 mg total) by mouth every other day., Disp: 45 tablet, Rfl: 3 .  benazepril-hydrochlorthiazide (LOTENSIN HCT) 10-12.5 MG tablet, Take 1 tablet by mouth daily., Disp: 90 tablet, Rfl: 3 .  loratadine (CLARITIN) 10 MG tablet, Take 1 tablet (10 mg total) by mouth daily as needed for allergies., Disp: 14 tablet, Rfl: 0 .  metoprolol succinate (TOPROL-XL) 25 MG  24 hr tablet, Take 1 tablet (25 mg total) by mouth daily., Disp: 90 tablet, Rfl: 3  Allergies Patient has no known allergies.  Family History  Problem Relation Age of Onset  . Diabetes Mother     Social History Social History   Tobacco Use  . Smoking status: Never Smoker  . Smokeless tobacco: Never Used  Substance Use Topics  . Alcohol use: No    Alcohol/week: 0.0 standard drinks  . Drug use: No    Review of Systems Constitutional: No fever Cardiovascular: Denies chest pain. Respiratory: Denies shortness of breath. Gastrointestinal: No abdominal pain.  No nausea, no vomiting.  No diarrhea.  No constipation.  Positive rectal bleeding Genitourinary: Negative for dysuria.  Negative for hematuria. Musculoskeletal: Negative for back pain. Skin: Negative for rash.  ____________________________________________   PHYSICAL EXAM:  VITAL SIGNS: ED Triage Vitals  Enc Vitals Group     BP 07/02/18 1048 (!) 146/85     Pulse Rate 07/02/18 1048 (!) 106 Recheck 116     Resp 07/02/18 1048 16     Temp 07/02/18 1048 98.4 F (36.9 C)     Temp Source 07/02/18 1048 Oral     SpO2 07/02/18 1048 99 %     Weight 07/02/18 1044 145 lb (65.8 kg)     Height 07/02/18 1044 5\' 10"  (1.778 m)     Head Circumference --      Peak Flow --      Pain Score 07/02/18 1044 0     Pain Loc --  Pain Edu? --      Excl. in GC? --     Constitutional: Alert and oriented. Well appearing and in no acute distress. ENT      Head: Normocephalic and atraumatic. Cardiovascular: Normal rate, regular rhythm. Grossly normal heart sounds.  Good peripheral circulation. Respiratory: Normal respiratory effort without tachypnea nor retractions. Breath sounds are clear and equal bilaterally. No wheezes, rales, rhonchi. Gastrointestinal: Soft and nontender. Normal Bowel sounds. No CVA tenderness. Rectal: Declined chaperone.  Mild amount of dried red blood around rectum.  No external hemorrhoid noted. Musculoskeletal:  Steady gait. Neurologic:  Normal speech and language. Speech is normal. No gait instability.  Skin:  Skin is warm, dry and intact. No rash noted. Psychiatric: Mood and affect are normal. Speech and behavior are normal. Patient exhibits appropriate insight and judgment   ___________________________________________   LABS (all labs ordered are listed, but only abnormal results are displayed)  Labs Reviewed - No data to display  PROCEDURES Procedures   INITIAL IMPRESSION / ASSESSMENT AND PLAN / ED COURSE  Pertinent labs & imaging results that were available during my care of the patient were reviewed by me and considered in my medical decision making (see chart for details).  Patient overall well-appearing.  Declines pain.  Acute rectal bleeding starting this morning, chronic aspirin therapy.  No history of same.  Mildly tachycardic.  Recommend further evaluation emergency room at this time due to concern of acute GI bleed.  Patient agrees this plan and states his wife will take him to [facility]. __________________________________________   FINAL CLINICAL IMPRESSION(S) / ED DIAGNOSES  Final diagnoses:  Rectal bleeding     ED Discharge Orders    None       Note: This dictation was prepared with Dragon dictation along with smaller phrase technology. Any transcriptional errors that result from this process are unintentional.         Renford DillsMiller, Celicia Minahan, NP 07/02/18 1154

## 2018-07-02 NOTE — Consult Note (Signed)
[facility] Vascular Consult Note  MRN : [id number]  Kevin Acosta is a 73 y.o. (1945-06-14) male who presents with chief complaint of  Chief Complaint  Patient presents with  . Rectal Bleeding  .  History of Present Illness: I am asked to see the patient by Dr. Cherlynn Kaiser for GI bleed.  The patient has a 1 day history of lower GI bleeding.  He has had about 9 episodes of bright red blood per rectum.  This is reasonably painless.  No previous episodes similar to this.  No fevers or chills or signs of infection.  As part of his work-up he underwent a bleeding scan which I have independently reviewed.  This is positive for bleeding from what appears to be the sigmoid colon in the left lower quadrant.  Current Facility-Administered Medications  Medication Dose Route Frequency Provider Last Rate Last Dose  . 0.9 %  sodium chloride infusion   Intravenous Continuous Houston Siren, MD 100 mL/hr at 07/02/18 2100    . acetaminophen (TYLENOL) tablet 650 mg  650 mg Oral Q6H PRN Houston Siren, MD       Or  . acetaminophen (TYLENOL) suppository 650 mg  650 mg Rectal Q6H PRN Houston Siren, MD      . ondansetron (ZOFRAN) tablet 4 mg  4 mg Oral Q6H PRN Houston Siren, MD       Or  . ondansetron (ZOFRAN) injection 4 mg  4 mg Intravenous Q6H PRN Houston Siren, MD        Past Medical History:  Diagnosis Date  . Hyperlipidemia   . Hypertension     Past Surgical History:  Procedure Laterality Date  . NO PAST SURGERIES      Social History Social History   Tobacco Use  . Smoking status: Never Smoker  . Smokeless tobacco: Never Used  Substance Use Topics  . Alcohol use: No    Alcohol/week: 0.0 standard drinks  . Drug use: No    Family History Family History  Problem Relation Age of Onset  . Diabetes Mother   . Lung cancer Father   No bleeding or clotting disorders  No Known Allergies   REVIEW OF SYSTEMS (Negative unless  checked)  Constitutional: Weight loss  Fever  Chills Cardiac: Chest pain   Chest pressure   Palpitations   Shortness of breath when laying flat   Shortness of breath at rest   Shortness of breath with exertion. Vascular:  Pain in legs with walking   Pain in legs at rest   Pain in legs when laying flat   Claudication   Pain in feet when walking  Pain in feet at rest  Pain in feet when laying flat   History of DVT   Phlebitis   Swelling in legs   Varicose veins   Non-healing ulcers Pulmonary:   Uses home oxygen   Productive cough   Hemoptysis   Wheeze  COPD   Asthma Neurologic:  Dizziness  Blackouts   Seizures   History of stroke   History of TIA  Aphasia   Temporary blindness   Dysphagia   Weakness or numbness in arms   Weakness or numbness in legs Musculoskeletal:  Arthritis   Joint swelling   Joint pain   Low back pain Hematologic:  Easy bruising  Easy bleeding   Hypercoagulable state   Anemic  Hepatitis Gastrointestinal:  Blood in stool   Vomiting blood  Gastroesophageal reflux/heartburn     Difficulty swallowing. Genitourinary:  [] Chronic kidney disease   [] Difficult urination  [] Frequent urination  [] Burning with urination   [] Blood in urine Skin:  [] Rashes   [] Ulcers   [] Wounds Psychological:  [] History of anxiety   []  History of major depression.  Physical Examination  Vitals:   07/02/18 1415 07/02/18 1430 07/02/18 1438 07/02/18 1935  BP:  134/90 134/90 (!) 138/105  Pulse: 99 98 98 (!) 109  Resp:   17   Temp:    98.1 F (36.7 C)  TempSrc:    Oral  SpO2: 100% 99% 99% 98%   There is no height or weight on file to calculate BMI. Gen:  WD/WN, NAD Head: [place]/AT, No temporalis wasting.  Ear/Nose/Throat: Hearing grossly intact, nares w/o erythema or drainage, oropharynx w/o Erythema/Exudate Eyes: Sclera non-icteric, conjunctiva clear Neck: Trachea midline.  No JVD.  Pulmonary:  Good air  movement, respirations not labored, equal bilaterally.  Cardiac: RRR, normal S1, S2. Vascular:  Vessel Right Left  Radial Palpable Palpable                          PT Palpable Palpable  DP Palpable Palpable   Gastrointestinal: soft, non-tender/non-distended. No guarding/reflex.  Musculoskeletal: M/S 5/5 throughout.  Extremities without ischemic changes.  No deformity or atrophy. No edema. Neurologic: Sensation grossly intact in extremities.  Symmetrical.  Speech is fluent. Motor exam as listed above. Psychiatric: Judgment intact, Mood & affect appropriate for pt's clinical situation. Dermatologic: No rashes or ulcers noted.  No cellulitis or open wounds.       CBC Lab Results  Component Value Date   WBC 11.3 (H) 07/02/2018   HGB 12.4 (L) 07/02/2018   HCT 43.7 07/02/2018   MCV 91.2 07/02/2018   PLT 188 07/02/2018    BMET    Component Value Date/Time   NA 137 07/02/2018 1227   NA 138 07/18/2015 0934   K 3.8 07/02/2018 1227   CL 101 07/02/2018 1227   CO2 25 07/02/2018 1227   GLUCOSE 158 (H) 07/02/2018 1227   BUN 19 07/02/2018 1227   BUN 21 07/18/2015 0934   CREATININE 0.70 07/02/2018 1227   CALCIUM 9.1 07/02/2018 1227   GFRNONAA >60 07/02/2018 1227   GFRAA >60 07/02/2018 1227   Estimated Creatinine Clearance: 76.5 mL/min (by C-G formula based on SCr of 0.7 mg/dL).  COAG No results found for: INR, PROTIME  Radiology Nm Gi Blood Loss  Result Date: 07/02/2018 CLINICAL DATA:  Bright red blood per rectum. EXAM: NUCLEAR MEDICINE GASTROINTESTINAL BLEEDING SCAN TECHNIQUE: Sequential abdominal images were obtained following intravenous administration of Tc-2553m labeled red blood cells. RADIOPHARMACEUTICALS:  22.7 mCi Tc-5153m pertechnetate in-vitro labeled red cells. COMPARISON:  CT dated 05/22/2005 FINDINGS: Extraluminal accumulation of radiotracer is visualized in the left lower quadrant and midline high pelvis. There appears to be some reflux of tracer proximally  towards the splenic flexure. The study was positive at the 1 hour and 10 minutes mark. No other significant abnormality detected on this examination. There may be some accumulation of radiotracer in the bladder due to the presence of free pertechnetate. IMPRESSION: Positive GI bleed study with accumulation of radiotracer at the level of the sigmoid colon in the left lower quadrant. The study was positive at about the 1 hour mark. These results will be called to the ordering clinician or representative by the Radiologist Assistant, and communication documented in the PACS or zVision Dashboard. Electronically Signed   By: Katherine Mantlehristopher  Green  M.D.   On: 07/02/2018 19:38      Assessment/Plan 1. GI bleed.  Bleeding scan is grossly positive in the left lower quadrant in the area consistent with a sigmoid colon.  I have independently reviewed this bleeding scan.  The patient has continued active bleeding and embolization would be the best treatment option.  Continued active bleeding is a dangerous issue for obvious reasons.  Discussed that procedures are directed at stopping bleeding and embolization would be an alternative to surgery.  Endoscopic options for this would be fairly poor.  There is a roughly 10% risk of continued bleeding, rebleeding or recurrent bleeding at some point in the future.  There is a 1 to 2% risk of ischemia.  This would be a far less invasive procedure than open surgery. 2.  Hypertension.  Not currently on any medications.  Blood pressure is mildly elevated in the hospital and blood pressure control important in reducing the progression of atherosclerotic disease.  3. Hyperlipidemia. Not currently on any medications and lipid control important in reducing the progression of atherosclerotic disease.     Festus Barren, MD  07/02/2018 10:35 PM    This note was created with Dragon medical transcription system.  Any error is purely unintentional

## 2018-07-02 NOTE — ED Notes (Signed)
Patent off unit to bleeding scan

## 2018-07-02 NOTE — ED Notes (Signed)
Patient type and screened.

## 2018-07-02 NOTE — ED Provider Notes (Signed)
[facility] Emergency Department Provider Note       Time seen: ----------------------------------------- 2:06 PM on 07/02/2018 -----------------------------------------   I have reviewed the triage vital signs and the nursing notes.  HISTORY   Chief Complaint Rectal Bleeding   HPI Kevin Acosta is a 73 y.o. male with a history of hypertension, hyperlipidemia, PE who presents to the ED for bloody stool that occurred starting this morning.  Patient's had 4 episodes of passing bright red blood since this morning.  He denies any abdominal pain, denies fevers, chills or other complaints.  Past Medical History:  Diagnosis Date  . Hyperlipidemia   . Hypertension     Patient Active Problem List   Diagnosis Date Noted  . Prediabetes 07/19/2015  . Hx pulmonary embolism 07/18/2015  . Hyperlipidemia 08/15/2014  . Essential hypertension 08/15/2014  . Arthritis 08/15/2014    Past Surgical History:  Procedure Laterality Date  . NO PAST SURGERIES      Allergies Patient has no known allergies.  Social History Social History   Tobacco Use  . Smoking status: Never Smoker  . Smokeless tobacco: Never Used  Substance Use Topics  . Alcohol use: No    Alcohol/week: 0.0 standard drinks  . Drug use: No   Review of Systems Constitutional: Negative for fever. Cardiovascular: Negative for chest pain. Respiratory: Negative for shortness of breath. Gastrointestinal: Positive for rectal bleeding Musculoskeletal: Negative for back pain. Skin: Negative for rash. Neurological: Negative for headaches, focal weakness or numbness.  All systems negative/normal/unremarkable except as stated in the HPI  ____________________________________________   PHYSICAL EXAM:  VITAL SIGNS: ED Triage Vitals [07/02/18 1224]  Enc Vitals Group     BP 132/79     Pulse Rate (!) 121     Resp 18     Temp 98.1 F (36.7 C)     Temp Source Oral     SpO2 97 %     Weight       Height      Head Circumference      Peak Flow      Pain Score 0     Pain Loc      Pain Edu?      Excl. in GC?    Constitutional: Alert and oriented. Well appearing and in no distress. Eyes: Conjunctivae are normal. Normal extraocular movements. Cardiovascular: Rapid rate, regular rhythm. No murmurs, rubs, or gallops. Respiratory: Normal respiratory effort without tachypnea nor retractions. Breath sounds are clear and equal bilaterally. No wheezes/rales/rhonchi. Gastrointestinal: Soft and nontender. Normal bowel sounds Musculoskeletal: Nontender with normal range of motion in extremities. No lower extremity tenderness nor edema. Neurologic:  Normal speech and language. No gross focal neurologic deficits are appreciated.  Skin:  Skin is warm, dry and intact. No rash noted. Psychiatric: Mood and affect are normal. Speech and behavior are normal.  ____________________________________________  ED COURSE:  As part of my medical decision making, I reviewed the following data within the electronic MEDICAL RECORD NUMBER History obtained from family if available, nursing notes, old chart and ekg, as well as notes from prior ED visits. Patient presented for rectal bleeding, we will assess with labs and imaging as indicated at this time.   Procedures  Kevin Acosta was evaluated in Emergency Department on 07/02/2018 for the symptoms described in the history of present illness. He was evaluated in the context of the global COVID-19 pandemic, which necessitated consideration that the patient might be at risk for infection with the  SARS-CoV-2 virus that causes COVID-19. Institutional protocols and algorithms that pertain to the evaluation of patients at risk for COVID-19 are in a state of rapid change based on information released by regulatory bodies including the CDC and federal and state organizations. These policies and algorithms were followed during the patient's care in the ED.   ____________________________________________   LABS (pertinent positives/negatives)  Labs Reviewed  COMPREHENSIVE METABOLIC PANEL - Abnormal; Notable for the following components:      Result Value   Glucose, Bld 158 (*)    All other components within normal limits  CBC - Abnormal; Notable for the following components:   WBC 11.3 (*)    All other components within normal limits  POC OCCULT BLOOD, ED  TYPE AND SCREEN   ____________________________________________   DIFFERENTIAL DIAGNOSIS   Diverticulosis, hemorrhoidal bleeding unlikely, diverticulitis unlikely  FINAL ASSESSMENT AND PLAN  Rectal bleeding   Plan: The patient had presented for for 5 episodes of rectal bleeding. Patient's labs were reassuring at this time.  He did not require any imaging during this visit.  I will discuss with the hospitalist for admission, serial H&H and GI consultation.   Ulice DashJohnathan E , MD    Note: This note was generated in part or whole with voice recognition software. Voice recognition is usually quite accurate but there are transcription errors that can and very often do occur. I apologize for any typographical errors that were not detected and corrected.     Emily Filbert,  E, MD 07/02/18 1409

## 2018-07-02 NOTE — Consult Note (Addendum)
Cephas Darby, MD [address]  [place]  [place], [place] [address]  Main: [phone]  Fax: [phone] Pager: [phone]   Consultation  Referring Provider:     No ref. provider found Primary Care Physician:  Ellene Route Primary Gastroenterologist: None       Reason for Consultation:     Rectal bleeding  Date of Admission:  07/02/2018 Date of Consultation:  07/02/2018         HPI:   Kevin Acosta is a 73 y.o. 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  He denies similar episodes in the past.  He denies constipation or diarrhea.  He takes baby aspirin only.  He is quite active.  When I interviewed the patient in ER, he had another large episode of rectal bleeding which I witnessed.  Patient has been hemodynamically stable in the ER.  Hemoglobin was normal. He reports having had a colonoscopy about 5 to 6 years ago, was told that he had hemorrhoids only  NSAIDs: None  Antiplts/Anticoagulants/Anti thrombotics: Aspirin 81  GI Procedures: Colonoscopy 5 to 6 years ago, reportedly normal except hemorrhoids  Past Medical History:  Diagnosis Date   Hyperlipidemia    Hypertension     Past Surgical History:  Procedure Laterality Date   NO PAST SURGERIES      Prior to Admission medications   Medication Sig Start Date End Date Taking? Authorizing Provider  aspirin 81 MG tablet Take 81 mg by mouth daily.   Yes [provider]  Multiple Vitamins-Minerals (CENTRUM SILVER 50+MEN) TABS Take 1 tablet by mouth daily.   Yes [provider]   Current Facility-Administered Medications:    technetium labeled red blood cells (ULTRATAG) injection kit 20 millicurie, 20 millicurie, Intravenous, Once PRN, Jennette Banker, MD  Current Outpatient Medications:    aspirin 81 MG tablet, Take 81 mg by mouth  daily., Disp: , Rfl:    Multiple Vitamins-Minerals (CENTRUM SILVER 50+MEN) TABS, Take 1 tablet by mouth daily., Disp: , Rfl:    Family History  Problem Relation Age of Onset   Diabetes Mother    Lung cancer Father      Social History   Tobacco Use   Smoking status: Never Smoker   Smokeless tobacco: Never Used  Substance Use Topics   Alcohol use: No    Alcohol/week: 0.0 standard drinks   Drug use: No    Allergies as of 07/02/2018   (No Known Allergies)    Review of Systems:    All systems reviewed and negative except where noted in HPI.   Physical Exam:  Vital signs in last 24 hours: Temp:  [98.1 F (36.7 C)-98.4 F (36.9 C)] 98.1 F (36.7 C) (05/23 1224) Pulse Rate:  [98-121] 98 (05/23 1438) Resp:  [16-18] 17 (05/23 1438) BP: (132-170)/(79-90) 134/90 (05/23 1438) SpO2:  [97 %-100 %] 99 % (05/23 1438) Weight:  [65.8 kg] 65.8 kg (05/23 1044)   General:   Pleasant, cooperative in NAD Head:  Normocephalic and atraumatic. Eyes:   No icterus.   Conjunctiva pink. PERRLA. Ears:  Normal auditory acuity. Neck:  Supple; no masses or thyroidomegaly Lungs: Respirations even and unlabored. Lungs clear to auscultation bilaterally.   No wheezes, crackles, or rhonchi.  Heart:  Regular rate and rhythm;  Without murmur, clicks, rubs or gallops Abdomen:  Soft,  nondistended, nontender. Normal bowel sounds. No appreciable masses or hepatomegaly.  No rebound or guarding.  Rectal:  Not performed. Msk:  Symmetrical without gross deformities.  Strength normal Extremities:  Without edema, cyanosis or clubbing. Neurologic:  Alert and oriented x3;  grossly normal neurologically. Skin:  Intact without significant lesions or rashes. Cervical Nodes:  No significant cervical adenopathy. Psych:  Alert and cooperative. Normal affect.  LAB RESULTS: CBC Latest Ref Rng & Units 07/02/2018  WBC 4.0 - 10.5 K/uL 11.3(H)  Hemoglobin 13.0 - 17.0 g/dL 14.4  Hematocrit 39.0 - 52.0 % 43.7    Platelets 150 - 400 K/uL 188    BMET BMP Latest Ref Rng & Units 07/02/2018 07/18/2015 01/14/2015  Glucose 70 - 99 mg/dL 158(H) 123(H) 114(H)  BUN 8 - 23 mg/dL _0 Creatinine 0.61 - 1.24 mg/dL 0.70 0.94 1.00  BUN/Creat Ratio 10 - 24 - 22 19  Sodium 135 - 145 mmol/L 137 138 139  Potassium 3.5 - 5.1 mmol/L 3.8 4.8 4.7  Chloride 98 - 111 mmol/L 101 96 97  CO2 22 - 32 mmol/L _1 Calcium 8.9 - 10.3 mg/dL 9.1 10.0 9.8    LFT Hepatic Function Latest Ref Rng & Units 07/02/2018 07/18/2015 01/14/2015  Total Protein 6.5 - 8.1 g/dL 6.9 7.9 7.1  Albumin 3.5 - 5.0 g/dL 4.0 4.7 4.3  AST 15 - 41 U/L _2 ALT 0 - 44 U/L _3 Alk Phosphatase 38 - 126 U/L 61 83 75  Total Bilirubin 0.3 - 1.2 mg/dL 0.7 1.1 0.7     STUDIES: No results found.    Impression / Plan:   Kevin Acosta is a 73 y.o. Caucasian male with no significant past medical history presents with painless rectal bleeding Most likely representing lower GI bleed from diverticulosis or Dieulafoy's. Stat bleeding scan has been ordered.  If bleeding scan comes back positive, recommend to consult vascular surgery or interventional radiology for embolization.  If bleeding scan is negative, discussed with patient about colonoscopy and he is agreeable.  I also discussed with patient that most common cause of this type of presentation is from diverticulosis which is self-limited and probably occurred in setting of taking aspirin 81 mg and his age 73 If bleeding scan negative, keep patient on clear liquid diet Monitor monitor CBC closely, expect hemoglobin to decrease due to several bleeding episodes  Thank you for involving me in the care of this patient.  Will follow along with you    LOS: 0 days   Sherri Sear, MD  07/02/2018, 4:52 PM   Note: This dictation was prepared with Dragon dictation along with smaller phrase technology. Any transcriptional errors that result from this process are unintentional.

## 2018-07-02 NOTE — ED Triage Notes (Signed)
Patient reports having bloody diarrhea and rectal bleeding after having 3 bowel movements this morning.  Patient denies any pain.  Patient denies N/V.  Patient denies fevers.

## 2018-07-02 NOTE — Op Note (Signed)
[facility] Percutaneous Study/Intervention Procedural Note     Surgeon(s): [employer]  Assistants: none  Pre-operative Diagnosis: 1. Lower GI bleed   Post-operative diagnosis: Same  Procedure(s) Performed: 1. Ultrasound guidance for vascular access right femoral artery 2. Catheter placement into 2 separate sigmoidal branches of the IMA and the left colonic branch of the IMA from right femoral approach 3. Aortogram and selective angiogram of the IMA including 2 sigmoidal branches and a left colonic branch 4. Microbead embolization of the direct sigmoidal branch feeding the blush with 1 cc of 500-700  polyvinyl alcohol beads.  Microbead embolization was also performed to the 2 nearby branches each with 0.5 cc of 500 to 700 m polyvinyl alcohol beads.  1 was a sigmoidal branch just inferior to the main branch and the other was the left colonic branch. 5. StarClose closure device right femoral artery  Anesthesia: Moderate conscious sedation for approximately 30 minutes using 2 mg of Versed and 5 mg of Benadryl  EBL: 5 cc  Fluoro Time: 6 minutes  Contrast: 40 cc  Indications: Patient is a 73 y.o.male with brisk lower GI bleeding with resultant anemia. The patient has a nuclear medicine study showing active bleeding from the sigmoid colon. The patient is brought in for angiography for further evaluation and potential treatment. Risks and benefits are discussed and informed consent is obtained  Procedure: The patient was identified and appropriate procedural time out was performed. The patient was then placed supine on the table and prepped and draped in the usual sterile fashion.Moderate conscious sedation was administered during a face to face encounter with the patient throughout the procedure with my supervision of the RN administering medicines and monitoring the  patient's vital signs, pulse oximetry, telemetry and mental status throughout from the start of the procedure until the patient was taken to the recovery room.  Ultrasound was used to evaluate the right common femoral artery. It was patent . A digital ultrasound image was acquired. A Seldinger needle was used to access the right common femoral artery under direct ultrasound guidance and a permanent image was performed. A 0.035 J wire was advanced without resistance and a 5Fr sheath was placed. Pigtail catheter was placed into the aorta and an aortogram was performed. This demonstrated normal renal arteries bilaterally with normal aorta and iliac arteries without significant stenosis.  This was done in a slight RAO projection which helped opacify the origin of the IMA which was reasonably normal.  A VS 1 catheter was used to selectively cannulate the IMA.  This demonstrated an area in the sigmoid colon with an actual blush consistent with active extravasation and bleeding.  There appeared to be a sigmoidal branch feeding this directly with a nearby sigmoidal branch just inferior to this that also fed the area.  The left colonic branch fed an area superior to this which traversed downstream to this as well. Based on his continued bleeding and the nuclear medicine study I elected to treat this area with embolization. I initially advanced the Pro-Great microcatheter out the main sigmoidal branch and perform selective imaging which showed the blush at the terminus of this branch.  At this point I proceeded with treatment and instilled approximately 1 cc of 500 to 700 m polyvinyl alcohol beads in this location. Angiogram following this showed the main vessels to be open with less brisk filling and no further active extravasation. I then pulled back and cannulated the lightly more inferior sigmoidal branch with the  Pro-great microcatheter without difficulty. Selective imaging was performed which showed some feeding  to the area that was bleeding although the active blush was not seen.  Gentle treatment with 0.5 cc of 500 to 700 m polyvinyl alcohol beads were deployed in the more inferior sigmoid branch. Again, completion angiogram showed the main vessels to be open with less brisk filling.  I then went back to the main IMA.  The pro-great microcatheter was used to cannulate the superior going left colonic branch that also had some feeding to the area.  The prograde catheter was extended out into this branch and selective imaging was performed which did show collateral flow down to the area of bleeding although active extravasation was not seen.  Again, gentle treatment with 0.5 cc of 500 to 700 m polyvinyl alcohol beads were deployed in the left colonic branch.  Imaging was then performed through the V S1 catheter which showed the main vessels to be open but sluggish filling into the sigmoid and distal left colon with no active extravasation seen.  I elected to terminate the procedure. The diagnostic catheter was removed. StarClose closure device was deployed in usual fashion with excellent hemostatic result. The patient was taken to the recovery room in stable condition having tolerated the procedure well.     Findings:There was a blush of contrast with extravasation off of the sigmoid colon consistent with active bleeding.  This branch and the 2 nearby branches also feeding the area were treated with microbead embolization  Disposition: Patient was taken to the recovery room in stable condition having tolerated the procedure well.  Complications: None  Festus BarrenJason Naseem Varden 07/02/2018 11:28 PM   This note was created with Dragon Medical transcription system. Any errors in dictation are purely unintentional.

## 2018-07-03 DIAGNOSIS — K922 Gastrointestinal hemorrhage, unspecified: Secondary | ICD-10-CM | POA: Diagnosis present

## 2018-07-03 DIAGNOSIS — Z7982 Long term (current) use of aspirin: Secondary | ICD-10-CM | POA: Diagnosis not present

## 2018-07-03 DIAGNOSIS — E785 Hyperlipidemia, unspecified: Secondary | ICD-10-CM | POA: Diagnosis present

## 2018-07-03 DIAGNOSIS — K625 Hemorrhage of anus and rectum: Secondary | ICD-10-CM | POA: Diagnosis present

## 2018-07-03 DIAGNOSIS — K5731 Diverticulosis of large intestine without perforation or abscess with bleeding: Secondary | ICD-10-CM | POA: Diagnosis present

## 2018-07-03 DIAGNOSIS — Z801 Family history of malignant neoplasm of trachea, bronchus and lung: Secondary | ICD-10-CM | POA: Diagnosis not present

## 2018-07-03 DIAGNOSIS — Z20828 Contact with and (suspected) exposure to other viral communicable diseases: Secondary | ICD-10-CM | POA: Diagnosis present

## 2018-07-03 DIAGNOSIS — I10 Essential (primary) hypertension: Secondary | ICD-10-CM | POA: Diagnosis present

## 2018-07-03 DIAGNOSIS — Z86711 Personal history of pulmonary embolism: Secondary | ICD-10-CM | POA: Diagnosis not present

## 2018-07-03 DIAGNOSIS — Z833 Family history of diabetes mellitus: Secondary | ICD-10-CM | POA: Diagnosis not present

## 2018-07-03 DIAGNOSIS — D62 Acute posthemorrhagic anemia: Secondary | ICD-10-CM | POA: Diagnosis not present

## 2018-07-03 DIAGNOSIS — Z79899 Other long term (current) drug therapy: Secondary | ICD-10-CM | POA: Diagnosis not present

## 2018-07-03 DIAGNOSIS — R7303 Prediabetes: Secondary | ICD-10-CM | POA: Diagnosis present

## 2018-07-03 DIAGNOSIS — Z91041 Radiographic dye allergy status: Secondary | ICD-10-CM | POA: Diagnosis not present

## 2018-07-03 LAB — CBC
HCT: 32.2 % — ABNORMAL LOW (ref 39.0–52.0)
Hemoglobin: 10.6 g/dL — ABNORMAL LOW (ref 13.0–17.0)
MCH: 30.3 pg (ref 26.0–34.0)
MCHC: 32.9 g/dL (ref 30.0–36.0)
MCV: 92 fL (ref 80.0–100.0)
Platelets: 160 10*3/uL (ref 150–400)
RBC: 3.5 MIL/uL — ABNORMAL LOW (ref 4.22–5.81)
RDW: 12.7 % (ref 11.5–15.5)
WBC: 16 10*3/uL — ABNORMAL HIGH (ref 4.0–10.5)
nRBC: 0 % (ref 0.0–0.2)

## 2018-07-03 LAB — BASIC METABOLIC PANEL
Anion gap: 11 (ref 5–15)
BUN: 18 mg/dL (ref 8–23)
CO2: 22 mmol/L (ref 22–32)
Calcium: 8 mg/dL — ABNORMAL LOW (ref 8.9–10.3)
Chloride: 106 mmol/L (ref 98–111)
Creatinine, Ser: 0.79 mg/dL (ref 0.61–1.24)
GFR calc Af Amer: >60 mL/min (ref >60)
GFR calc non Af Amer: >60 mL/min (ref >60)
Glucose, Bld: 145 mg/dL — ABNORMAL HIGH (ref 70–99)
Potassium: 3.8 mmol/L (ref 3.5–5.1)
Sodium: 139 mmol/L (ref 135–145)

## 2018-07-03 LAB — HEMOGLOBIN AND HEMATOCRIT, BLOOD
HCT: 33.9 % — ABNORMAL LOW (ref 39.0–52.0)
Hemoglobin: 11.3 g/dL — ABNORMAL LOW (ref 13.0–17.0)

## 2018-07-03 MED ORDER — SODIUM CHLORIDE 0.9 % IV BOLUS
500.0000 mL | Freq: Once | INTRAVENOUS | Status: AC
  Administered 2018-07-03: 500 mL via INTRAVENOUS

## 2018-07-03 NOTE — Progress Notes (Signed)
Arlyss Repress, MD [address]  [address]  [place], [place] [address]  Main: [phone]  Fax: [phone] Pager: [phone]   Subjective: Patient reports having 1 small episode of bloody BM today. He underwent urgent embolization for lower GI bleed in sigmoid colon. Hb dropped from 14.4 to 11.3 today. Denies any complaints.   Objective: Vital signs in last 24 hours: Vitals:   07/03/18 0218 07/03/18 0233 07/03/18 0652 07/03/18 1220  BP: 110/69  127/66 (!) 141/73  Pulse: 78  81 93  Resp: 17   16  Temp: 97.7 F (36.5 C)  98.4 F (36.9 C) 98.5 F (36.9 C)  TempSrc: Oral  Oral Oral  SpO2: 100%  100% 100%  Weight:  65.7 kg    Height:   (1.778 m)     Weight change:   Intake/Output Summary (Last 24 hours) at 07/03/2018 1632 Last data filed at 07/03/2018 1400 Gross per 24 hour  Intake 1343.93 ml  Output 100 ml  Net 1243.93 ml     Exam: Heart:: Regular rate and rhythm or S1S2 present Lungs: normal and clear to auscultation Abdomen: soft, nontender, normal bowel sounds   Lab Results: CBC Latest Ref Rng & Units 07/03/2018 07/03/2018 07/02/2018  WBC 4.0 - 10.5 K/uL - 16.0(H) -  Hemoglobin 13.0 - 17.0 g/dL 11.3(L) 10.6(L) 12.4(L)  Hematocrit 39.0 - 52.0 % 33.9(L) 32.2(L) -  Platelets 150 - 400 K/uL - 160 -   CMP Latest Ref Rng & Units 07/03/2018 07/02/2018 07/18/2015  Glucose 70 - 99 mg/dL 644(I) 347(Q) 259(D)  BUN 8 - 23 mg/dL Creatinine 0.61 - 1.24 mg/dL 6.38 7.56 4.33  Sodium 135 - 145 mmol/L 139 137 138  Potassium 3.5 - 5.1 mmol/L 3.8 3.8 4.8  Chloride 98 - 111 mmol/L 106 101 96  CO2 22 - 32 mmol/L Calcium 8.9 - 10.3 mg/dL 8.0(L) 9.1 10.0  Total Protein 6.5 - 8.1 g/dL - 6.9 7.9  Total Bilirubin 0.3 - 1.2 mg/dL - 0.7 1.1  Alkaline Phos 38 - 126 U/L - 61 83  AST 15 - 41 U/L - 16 27  ALT 0 - 44 U/L - 21 30    Micro Results: Recent Results (from the past 240 hour(s))  SARS Coronavirus 2 (CEPHEID - Performed in [facility]  hospital lab), Hosp Order     Status: None   Collection Time: 07/02/18  2:39 PM  Result Value Ref Range Status   SARS Coronavirus 2 NEGATIVE NEGATIVE Final    Comment: (NOTE) If result is NEGATIVE SARS-CoV-2 target nucleic acids are NOT DETECTED. The SARS-CoV-2 RNA is generally detectable in upper and lower  respiratory specimens during the acute phase of infection. The lowest  concentration of SARS-CoV-2 viral copies this assay can detect is 250  copies / mL. A negative result does not preclude SARS-CoV-2 infection  and should not be used as the sole basis for treatment or other  patient management decisions.  A negative result may occur with  improper specimen collection / handling, submission of specimen other  than nasopharyngeal swab, presence of viral mutation(s) within the  areas targeted by this assay, and inadequate number of viral copies  (<250 copies / mL). A negative result must be combined with clinical  observations, patient history, and epidemiological information. If result is POSITIVE SARS-CoV-2 target nucleic acids are DETECTED. The SARS-CoV-2 RNA is generally detectable in upper and lower  respiratory specimens dur ing  the acute phase of infection.  Positive  results are indicative of active infection with SARS-CoV-2.  Clinical  correlation with patient history and other diagnostic information is  necessary to determine patient infection status.  Positive results do  not rule out bacterial infection or co-infection with other viruses. If result is PRESUMPTIVE POSTIVE SARS-CoV-2 nucleic acids MAY BE PRESENT.   A presumptive positive result was obtained on the submitted specimen  and confirmed on repeat testing.  While 2019 novel coronavirus  (SARS-CoV-2) nucleic acids may be present in the submitted sample  additional confirmatory testing may be necessary for epidemiological  and / or clinical management purposes  to differentiate between  SARS-CoV-2 and other  Sarbecovirus currently known to infect humans.  If clinically indicated additional testing with an alternate test  methodology [phone]([id number]) is advised. The SARS-CoV-2 RNA is generally  detectable in upper and lower respiratory sp ecimens during the acute  phase of infection. The expected result is Negative. Fact Sheet for Patients:  BoilerBrush.com.cyhttps://www.fda.gov/media/136312/download Fact Sheet for Healthcare Providers: https://pope.com/https://www.fda.gov/media/136313/download This test is not yet approved or cleared by the [place] FDA and has been authorized for detection and/or diagnosis of SARS-CoV-2 by FDA under an Emergency Use Authorization (EUA).  This EUA will remain in effect (meaning this test can be used) for the duration of the COVID-19 declaration under Section 564(b)(1) of the Act, 21 U.S.C. section 360bbb-3(b)(1), unless the authorization is terminated or revoked sooner. Performed at [facility], [address] [place], [place] [address]    Studies/Results: Nm Gi Blood Loss  Result Date: 07/02/2018 CLINICAL DATA:  Bright red blood per rectum. EXAM: NUCLEAR MEDICINE GASTROINTESTINAL BLEEDING SCAN TECHNIQUE: Sequential abdominal images were obtained following intravenous administration of Tc-4214m labeled red blood cells. RADIOPHARMACEUTICALS:  22.7 mCi Tc-7214m pertechnetate in-vitro labeled red cells. COMPARISON:  CT dated 05/22/2005 FINDINGS: Extraluminal accumulation of radiotracer is visualized in the left lower quadrant and midline high pelvis. There appears to be some reflux of tracer proximally towards the splenic flexure. The study was positive at the 1 hour and 10 minutes mark. No other significant abnormality detected on this examination. There may be some accumulation of radiotracer in the bladder due to the presence of free pertechnetate. IMPRESSION: Positive GI bleed study with accumulation of radiotracer at the level of the sigmoid colon in the left lower quadrant. The study was  positive at about the 1 hour mark. These results will be called to the ordering clinician or representative by the Radiologist Assistant, and communication documented in the PACS or zVision Dashboard. Electronically Signed   By: Katherine Mantlehristopher  Green M.D.   On: 07/02/2018 19:38   Medications:  I have reviewed the patient's current medications. Prior to Admission:  Medications Prior to Admission  Medication Sig Dispense Refill Last Dose   aspirin 81 MG tablet Take 81 mg by mouth daily.   07/02/2018 at Unknown time   Multiple Vitamins-Minerals (CENTRUM SILVER 50+MEN) TABS Take 1 tablet by mouth daily.   07/02/2018 at Unknown time   Scheduled:  Continuous:  WJX:[id number]:acetaminophen **OR** acetaminophen, ondansetron **OR** ondansetron (ZOFRAN) IV Anti-infectives (From admission, onward)   Start     Dose/Rate Route Frequency Ordered Stop   07/02/18 2316  ceFAZolin (ANCEF) IVPB 2g/100 mL premix     2 g 200 mL/hr over 30 Minutes Intravenous 30 min pre-op 07/02/18 2316 07/03/18 0215     Scheduled Meds: Continuous Infusions: PRN Meds:.acetaminophen **OR** acetaminophen, ondansetron **OR** ondansetron (ZOFRAN) IV   Assessment: Active Problems:  GI bleed   Lower GI bleed  S/p embolization  Plan: Avoid aspirin and other nsaids including aspirin 81mg  Ok to go home today Advised to take oral iron for next 1-2 months recheck CBC in 1 week by his PCP   LOS: 0 days   Hadessah Grennan 07/03/2018, 4:32 PM

## 2018-07-03 NOTE — Progress Notes (Signed)
[facility]   PATIENT NAME: Kevin Acosta    MR#:  [id number]  DATE OF BIRTH:  1945-08-23  SUBJECTIVE:  CHIEF COMPLAINT:   Chief Complaint  Patient presents with  . Rectal Bleeding   Some blood in the bowel movement after his procedure. No abdominal pain.  No vomiting.  Afebrile.  REVIEW OF SYSTEMS:    Review of Systems  Constitutional: Positive for malaise/fatigue. Negative for chills and fever.  HENT: Negative for sore throat.   Eyes: Negative for blurred vision, double vision and pain.  Respiratory: Negative for cough, hemoptysis, shortness of breath and wheezing.   Cardiovascular: Negative for chest pain, palpitations, orthopnea and leg swelling.  Gastrointestinal: Positive for blood in stool. Negative for abdominal pain, constipation, diarrhea, heartburn, nausea and vomiting.  Genitourinary: Negative for dysuria and hematuria.  Musculoskeletal: Negative for back pain and joint pain.  Skin: Negative for rash.  Neurological: Negative for sensory change, speech change, focal weakness and headaches.  Endo/Heme/Allergies: Does not bruise/bleed easily.  Psychiatric/Behavioral: Negative for depression. The patient is not nervous/anxious.     DRUG ALLERGIES:   Allergies  Allergen Reactions  . Contrast Media [Iodinated Diagnostic Agents] Other (See Comments)    Hypotension, Vagal,     VITALS:  Blood pressure 127/66, pulse 81, temperature 98.4 F (36.9 C), temperature source Oral, resp. rate 17, height 5\' 10"  (1.778 m), weight 65.7 kg, SpO2 100 %.  PHYSICAL EXAMINATION:   Physical Exam  GENERAL:  73 y.o.-year-old patient lying in the bed with no acute distress.  EYES: Pupils equal, round, reactive to light and accommodation. No scleral icterus. Extraocular muscles intact.  HEENT: Head atraumatic, normocephalic. Oropharynx and nasopharynx clear.  NECK:  Supple, no jugular venous distention. No thyroid enlargement, no tenderness.   LUNGS: Normal breath sounds bilaterally, no wheezing, rales, rhonchi. No use of accessory muscles of respiration.  CARDIOVASCULAR: S1, S2 normal. No murmurs, rubs, or gallops.  ABDOMEN: Soft, nontender, nondistended. Bowel sounds present. No organomegaly or mass.  EXTREMITIES: No cyanosis, clubbing or edema b/l.    NEUROLOGIC: Cranial nerves II through XII are intact. No focal Motor or sensory deficits b/l.   PSYCHIATRIC: The patient is alert and oriented x 3.  SKIN: No obvious rash, lesion, or ulcer.   LABORATORY PANEL:   CBC Recent Labs  Lab 07/03/18 0100 07/03/18 1010  WBC 16.0*  --   HGB 10.6* 11.3*  HCT 32.2* 33.9*  PLT 160  --    ------------------------------------------------------------------------------------------------------------------ Chemistries  Recent Labs  Lab 07/02/18 1227 07/03/18 0100  NA 137 139  K 3.8 3.8  CL 101 106  CO2 25 22  GLUCOSE 158* 145*  BUN 19 18  CREATININE 0.70 0.79  CALCIUM 9.1 8.0*  AST 16  --   ALT 21  --   ALKPHOS 61  --   BILITOT 0.7  --    ------------------------------------------------------------------------------------------------------------------  Cardiac Enzymes No results for input(s): TROPONINI in the last 168 hours. ------------------------------------------------------------------------------------------------------------------  RADIOLOGY:  Nm Gi Blood Loss  Result Date: 07/02/2018 CLINICAL DATA:  Bright red blood per rectum. EXAM: NUCLEAR MEDICINE GASTROINTESTINAL BLEEDING SCAN TECHNIQUE: Sequential abdominal images were obtained following intravenous administration of Tc-9343m labeled red blood cells. RADIOPHARMACEUTICALS:  22.7 mCi Tc-8443m pertechnetate in-vitro labeled red cells. COMPARISON:  CT dated 05/22/2005 FINDINGS: Extraluminal accumulation of radiotracer is visualized in the left lower quadrant and midline high pelvis. There appears to be some reflux of tracer proximally towards the splenic flexure. The  study  was positive at the 1 hour and 10 minutes mark. No other significant abnormality detected on this examination. There may be some accumulation of radiotracer in the bladder due to the presence of free pertechnetate. IMPRESSION: Positive GI bleed study with accumulation of radiotracer at the level of the sigmoid colon in the left lower quadrant. The study was positive at about the 1 hour mark. These results will be called to the ordering clinician or representative by the Radiologist Assistant, and communication documented in the PACS or zVision Dashboard. Electronically Signed   By: Katherine Mantle M.D.   On: 07/02/2018 19:38     ASSESSMENT AND PLAN:   *  . *Sigmoid colon bleeding.  Status post embolization by vascular surgery.  One episode of blood in stool after procedure likely old blood.  At this point monitor hemoglobin.  Advance diet.  If hemoglobin is stable will discharge home tomorrow  *Acute blood loss anemia.  Monitor hemoglobin.    Repeat in the morning.  *Hypertension.  Not on medications.  Monitor blood pressure.  DVT prophylaxis with SCDs  All the records are reviewed and case discussed with Care Management/Social Worker Management plans discussed with the patient, family and they are in agreement.  CODE STATUS: FULL CODE   TOTAL TIME TAKING CARE OF THIS PATIENT: 35 minutes.   POSSIBLE D/C IN 1-2 DAYS, DEPENDING ON CLINICAL CONDITION.  Molinda Bailiff Emma Schupp M.D on 07/03/2018 at 11:59 AM  Between 7am to 6pm - Pager - [phone]  After 6pm go to [email or web address] - password [email or web address]  [facility]  Office  [phone]  CC: Primary care physician; Nira Retort  Note: This dictation was prepared with Dragon dictation along with smaller phrase technology. Any transcriptional errors that result from this process are unintentional.

## 2018-07-03 NOTE — Plan of Care (Signed)
Patient doing well.  He has not had any bleeding since early this morning.  HGb improved.  Tolerating soft diet.  No significant changes.

## 2018-07-03 NOTE — Care Management Obs Status (Signed)
MEDICARE OBSERVATION STATUS NOTIFICATION   Patient Details  Name: Kevin Acosta MRN: [id number] Date of Birth: 11/02/1945   Medicare Observation Status Notification Given:  Yes    Micajah Dennin A Vona Whiters, RN 07/03/2018, 10:40 AM

## 2018-07-04 LAB — HEMOGLOBIN: Hemoglobin: 10 g/dL — ABNORMAL LOW (ref 13.0–17.0)

## 2018-07-04 MED ORDER — FERROUS SULFATE 325 (65 FE) MG PO TBEC: 325.0000 mg | DELAYED_RELEASE_TABLET | Freq: Two times a day (BID) | ORAL | 0 refills | Status: DC

## 2018-07-04 NOTE — Progress Notes (Signed)
Discharge instructions reviewed with patient including followup visits and new medications.  Understanding was verbalized and all questions were answered.  IV removed without complication; patient tolerated well.  Patient discharged home via wheelchair in stable condition escorted by nursing staff.  

## 2018-07-04 NOTE — Discharge Instructions (Addendum)
Resume diet and activity as before ° ° °

## 2018-07-05 ENCOUNTER — Encounter: Payer: Self-pay | Admitting: Vascular Surgery

## 2018-08-22 ENCOUNTER — Other Ambulatory Visit: Payer: Self-pay

## 2018-08-22 ENCOUNTER — Encounter: Payer: Self-pay | Admitting: Family Medicine

## 2018-08-22 ENCOUNTER — Ambulatory Visit (INDEPENDENT_AMBULATORY_CARE_PROVIDER_SITE_OTHER): Payer: Medicare Other | Admitting: Family Medicine

## 2018-08-22 DIAGNOSIS — M545 Low back pain, unspecified: Secondary | ICD-10-CM

## 2018-08-22 MED ORDER — TIZANIDINE HCL 2 MG PO TABS: 2.0000 mg | ORAL_TABLET | Freq: Every evening | ORAL | 1 refills | Status: DC | PRN

## 2018-08-22 NOTE — Progress Notes (Signed)
Office Visit Note   Patient: Kevin Acosta           Date of Birth: [birth year]           MRN: [id number] Visit Date: 08/22/2018 Requested by: Ellene Route [address] [place],  [place] [address] PCP: Ellene Route  Subjective: Chief Complaint  Patient presents with  . Lower Back - Pain    Pain right lower back x 1 week after loading/unloading a birdbath from a car. No radiating pain down the leg. No numbness/tingling in the foot/leg.    HPI: He is here with right-sided low back pain.  About a week ago he recalls unloading a concrete birdbath.  He did not have any pain immediately, but sometime after that he started having right-sided low back pain without radiation.  It bothers him with certain twisting motions.  He has had a previous low back problem that resolved with chiropractic, it felt much different than this.  He is not taking any medication for this.  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  He has not had any troubles since then.  He is otherwise been in excellent health.              ROS: No fevers or chills.  All other systems were reviewed and are negative.  Objective: Vital Signs: There were no vitals taken for this visit.  Physical Exam:  General:  Alert and oriented, in no acute distress. Pulm:  Breathing unlabored. Psy:  Normal mood, congruent affect. Skin: No rash on his skin. Low back: No midline tenderness over the spinous processes.  There is mild tenderness at the superior aspect of the right SI joint and moderate tenderness at the quadratus lumborum insertion on the iliac crest.  Palpation in the musculature seems to reproduce the majority of his pain.  No pain in the sciatic notch, negative straight leg raise, lower extremity strength and reflexes are normal.  Imaging: None today.  Assessment & Plan: 1.  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  -Trial of Zanaflex at night as needed.  He will go back to his chiropractor for a couple visits.  If no improvement, then physical therapy.  If still no improvement then x-rays.     Procedures: No procedures performed  No notes on file     PMFS History: Patient Active Problem List   Diagnosis Date Noted  . Lower GI bleed 07/03/2018  . GI bleed 07/02/2018  . Prediabetes 07/19/2015  . Hx pulmonary embolism 07/18/2015  . Hyperlipidemia 08/15/2014  . Essential hypertension 08/15/2014  . Arthritis 08/15/2014   Past Medical History:  Diagnosis Date  . Hyperlipidemia   . Hypertension     Family History  Problem Relation Age of Onset  . Diabetes Mother   . Lung cancer Father     Past Surgical History:  Procedure Laterality Date  . NO PAST SURGERIES    . VISCERAL ANGIOGRAPHY N/A 07/02/2018   Procedure: VISCERAL ANGIOGRAPHY with possible embolization;  Acosta: Algernon Huxley, MD;  Location: [facility];  Service: Cardiovascular;  Laterality: N/A;   Social History   Occupational History  . Not on file  Tobacco Use  . Smoking status: Never Smoker  . Smokeless tobacco: Never Used  Substance and Sexual Activity  . Alcohol use: No    Alcohol/week: 0.0 standard drinks  . Drug use: No  .  Sexual activity: Not on file

## 2019-12-19 ENCOUNTER — Encounter: Payer: Self-pay | Admitting: Family Medicine

## 2019-12-19 ENCOUNTER — Ambulatory Visit (INDEPENDENT_AMBULATORY_CARE_PROVIDER_SITE_OTHER): Payer: Medicare PPO | Admitting: Family Medicine

## 2019-12-19 ENCOUNTER — Other Ambulatory Visit: Payer: Self-pay

## 2019-12-19 DIAGNOSIS — M65332 Trigger finger, left middle finger: Secondary | ICD-10-CM

## 2019-12-19 MED ORDER — DICLOFENAC SODIUM 1 % EX GEL: 4.0000 g | Freq: Four times a day (QID) | CUTANEOUS | 6 refills | Status: DC | PRN

## 2019-12-19 NOTE — Progress Notes (Signed)
   Office Visit Note   Patient: Kevin Acosta           Date of Birth: 1945/03/05           MRN: [id number] Visit Date: 12/19/2019 Requested by: Nira Retort [address] [place],  [place] [address] PCP: Nira Retort  Subjective: Chief Complaint  Patient presents with  . Left Middle Finger - Pain    Finger is stuck in flexed position since the weekend. Cannot straighten the finger at all. Has had some catching in that finger some over the last few months. Been doing some home renovations.    HPI: He is here with left third finger triggering. Over the past couple months it has been catching intermittently, but for the past 4 to 5 days it has been stuck in flexion.  He has been doing a lot of renovation on their new property.              ROS:   All other systems were reviewed and are negative.  Objective: Vital Signs: There were no vitals taken for this visit.  Physical Exam:  General:  Alert and oriented, in no acute distress. Pulm:  Breathing unlabored. Psy:  Normal mood, congruent affect.  Left hand: His third finger is stuck in flexion.  He has a tender nodule at the A1 pulley.  With gentle manipulation, I was able to straighten his finger.  Imaging: No results found.  Assessment & Plan: 1.  Left third trigger finger -Discussed options with him and elected to splint the finger in extension for the next week.  He will use Voltaren gel topically and ice applications. -If trigger finger persist, we will inject with cortisone.     Procedures: No procedures performed  No notes on file     PMFS History: Patient Active Problem List   Diagnosis Date Noted  . Lower GI bleed 07/03/2018  . GI bleed 07/02/2018  . Prediabetes 07/19/2015  . Hx pulmonary embolism 07/18/2015  . Hyperlipidemia 08/15/2014  . Essential hypertension 08/15/2014  . Arthritis 08/15/2014   Past Medical History:  Diagnosis Date  . Hyperlipidemia   . Hypertension       Family History  Problem Relation Age of Onset  . Diabetes Mother   . Lung cancer Father     Past Surgical History:  Procedure Laterality Date  . NO PAST SURGERIES    . VISCERAL ANGIOGRAPHY N/A 07/02/2018   Procedure: VISCERAL ANGIOGRAPHY with possible embolization;  Surgeon: Annice Needy, MD;  Location: [facility];  Service: Cardiovascular;  Laterality: N/A;   Social History   Occupational History  . Not on file  Tobacco Use  . Smoking status: Never Smoker  . Smokeless tobacco: Never Used  Vaping Use  . Vaping Use: Never used  Substance and Sexual Activity  . Alcohol use: No    Alcohol/week: 0.0 standard drinks  . Drug use: No  . Sexual activity: Not on file

## 2021-05-17 ENCOUNTER — Emergency Department
Admission: EM | Admit: 2021-05-17 | Discharge: 2021-05-17 | Disposition: A | Discharge status: Home / Self Care | Payer: Medicare PPO | Attending: Emergency Medicine | Admitting: Emergency Medicine

## 2021-05-17 ENCOUNTER — Ambulatory Visit
Admission: EM | Admit: 2021-05-17 | Discharge: 2021-05-17 | Disposition: A | Discharge status: Home / Self Care | Payer: Medicare PPO | Source: Home / Self Care

## 2021-05-17 ENCOUNTER — Other Ambulatory Visit: Payer: Self-pay

## 2021-05-17 ENCOUNTER — Ambulatory Visit (INDEPENDENT_AMBULATORY_CARE_PROVIDER_SITE_OTHER): Payer: Medicare PPO

## 2021-05-17 DIAGNOSIS — D72829 Elevated white blood cell count, unspecified: Secondary | ICD-10-CM | POA: Diagnosis not present

## 2021-05-17 DIAGNOSIS — R059 Cough, unspecified: Secondary | ICD-10-CM | POA: Insufficient documentation

## 2021-05-17 DIAGNOSIS — R0602 Shortness of breath: Secondary | ICD-10-CM | POA: Insufficient documentation

## 2021-05-17 DIAGNOSIS — J168 Pneumonia due to other specified infectious organisms: Secondary | ICD-10-CM | POA: Diagnosis not present

## 2021-05-17 DIAGNOSIS — Z86711 Personal history of pulmonary embolism: Secondary | ICD-10-CM | POA: Insufficient documentation

## 2021-05-17 DIAGNOSIS — Z20822 Contact with and (suspected) exposure to covid-19: Secondary | ICD-10-CM | POA: Insufficient documentation

## 2021-05-17 DIAGNOSIS — R000 Tachycardia, unspecified: Secondary | ICD-10-CM | POA: Insufficient documentation

## 2021-05-17 DIAGNOSIS — I10 Essential (primary) hypertension: Secondary | ICD-10-CM | POA: Diagnosis not present

## 2021-05-17 DIAGNOSIS — J159 Unspecified bacterial pneumonia: Secondary | ICD-10-CM

## 2021-05-17 DIAGNOSIS — R0789 Other chest pain: Secondary | ICD-10-CM | POA: Diagnosis present

## 2021-05-17 DIAGNOSIS — R509 Fever, unspecified: Secondary | ICD-10-CM | POA: Insufficient documentation

## 2021-05-17 DIAGNOSIS — J189 Pneumonia, unspecified organism: Secondary | ICD-10-CM

## 2021-05-17 HISTORY — DX: Other pulmonary embolism without acute cor pulmonale: I26.99

## 2021-05-17 LAB — CBC WITH DIFFERENTIAL/PLATELET
Abs Immature Granulocytes: 0.05 10*3/uL (ref 0.00–0.07)
Basophils Absolute: 0.1 10*3/uL (ref 0.0–0.1)
Basophils Relative: 1 %
Eosinophils Absolute: 0 10*3/uL (ref 0.0–0.5)
Eosinophils Relative: 0 %
HCT: 44.7 % (ref 39.0–52.0)
Hemoglobin: 14.7 g/dL (ref 13.0–17.0)
Immature Granulocytes: 0 %
Lymphocytes Relative: 7 %
Lymphs Abs: 0.9 10*3/uL (ref 0.7–4.0)
MCH: 29.9 pg (ref 26.0–34.0)
MCHC: 32.9 g/dL (ref 30.0–36.0)
MCV: 91 fL (ref 80.0–100.0)
Monocytes Absolute: 1.2 10*3/uL — ABNORMAL HIGH (ref 0.1–1.0)
Monocytes Relative: 10 %
Neutro Abs: 10.4 10*3/uL — ABNORMAL HIGH (ref 1.7–7.7)
Neutrophils Relative %: 82 %
Platelets: 153 10*3/uL (ref 150–400)
RBC: 4.91 MIL/uL (ref 4.22–5.81)
RDW: 12.7 % (ref 11.5–15.5)
WBC: 12.7 10*3/uL — ABNORMAL HIGH (ref 4.0–10.5)
nRBC: 0 % (ref 0.0–0.2)

## 2021-05-17 LAB — COMPREHENSIVE METABOLIC PANEL
ALT: 21 U/L (ref 0–44)
AST: 27 U/L (ref 15–41)
Albumin: 3.8 g/dL (ref 3.5–5.0)
Alkaline Phosphatase: 60 U/L (ref 38–126)
Anion gap: 8 (ref 5–15)
BUN: 16 mg/dL (ref 8–23)
CO2: 25 mmol/L (ref 22–32)
Calcium: 9 mg/dL (ref 8.9–10.3)
Chloride: 100 mmol/L (ref 98–111)
Creatinine, Ser: 0.82 mg/dL (ref 0.61–1.24)
GFR, Estimated: >60 mL/min (ref >60)
Glucose, Bld: 149 mg/dL — ABNORMAL HIGH (ref 70–99)
Potassium: 3.8 mmol/L (ref 3.5–5.1)
Sodium: 133 mmol/L — ABNORMAL LOW (ref 135–145)
Total Bilirubin: 1.5 mg/dL — ABNORMAL HIGH (ref 0.3–1.2)
Total Protein: 7.7 g/dL (ref 6.5–8.1)

## 2021-05-17 LAB — TROPONIN I (HIGH SENSITIVITY): Troponin I (High Sensitivity): 6 ng/L (ref <18)

## 2021-05-17 LAB — D-DIMER, QUANTITATIVE: D-Dimer, Quant: 0.56 ug/mL-FEU — ABNORMAL HIGH (ref 0.00–0.50)

## 2021-05-17 LAB — RESP PANEL BY RT-PCR (FLU A&B, COVID) ARPGX2
Influenza A by PCR: NEGATIVE
Influenza B by PCR: NEGATIVE
SARS Coronavirus 2 by RT PCR: NEGATIVE

## 2021-05-17 MED ORDER — DOXYCYCLINE HYCLATE 100 MG PO CAPS: 100.0000 mg | ORAL_CAPSULE | Freq: Two times a day (BID) | ORAL | 0 refills | Status: AC

## 2021-05-17 MED ORDER — ACETAMINOPHEN 325 MG PO TABS
650.0000 mg | ORAL_TABLET | Freq: Once | ORAL | Status: AC
  Administered 2021-05-17: 650 mg via ORAL

## 2021-05-17 MED ORDER — DOXYCYCLINE HYCLATE 100 MG PO TABS
100.0000 mg | ORAL_TABLET | Freq: Once | ORAL | Status: AC
  Administered 2021-05-17: 100 mg via ORAL
  Filled 2021-05-17: qty 1

## 2021-05-17 NOTE — ED Notes (Signed)
Patient is being discharged from the Urgent Care and sent to the [facility] Emergency Department via private vehicle with his wife . Per Ihor Gully, patient is in need of higher level of care due to Pneumonia. Patient is aware and verbalizes understanding of plan of care.  ?Vitals:  ? 05/17/21 1227  ?BP: (!) 141/126  ?Pulse: (!) 108  ?Resp: 18  ?Temp: (!) 100.4 ?F (38 ?C)  ?SpO2: 98%  ?  ?

## 2021-05-17 NOTE — ED Notes (Signed)
Extra lt grn tube sent with blue tube in case needed.  ?

## 2021-05-17 NOTE — ED Notes (Signed)
EDP Z. Katrinka Blazing notified in person of pt's BP reading. Verbal from EDP that pt ok to have it checked again at PCP next week.  ?

## 2021-05-17 NOTE — Discharge Instructions (Addendum)
-  Please head to the ED for further management. With your vital signs and pneumonia, I want to rule out something called a pulmonary embolism. Your risk for this is higher than average given your history. ?

## 2021-05-17 NOTE — Discharge Instructions (Addendum)
Your blood pressure was elevated today during your emergency room visit.  Please have this rechecked when you follow-up with your PCP and for repeat chest x-ray to ensure resolution of the opacity seen today. ?

## 2021-05-17 NOTE — ED Triage Notes (Signed)
Pt present cough with fever. Symptom started a week ago with the cough. The fever started today.  ?

## 2021-05-17 NOTE — ED Provider Notes (Signed)
?[facility] ? ? ? ?CSN: [id number] ?Arrival date & time: 05/17/21  1209 ? ? ?  ? ?History   ?Chief Complaint ?Chief Complaint  ?Patient presents with  ? Cough  ? Fever  ?   ?  ? ? ?HPI ?Kevin Acosta is a 76 y.o. male presenting with cough and fevers, symptoms getting progressively worse for the last week.  History PE, lower GI bleed.  Describes nonproductive cough, dyspnea on exertion, fevers and chills.  Denies chest pain today, there was some vague right-sided chest pain 1 day ago.  No formal diagnosis of pulmonary disease.  States his PE was unprovoked, occurred about 20 years ago.  He has not been on long-term anticoagulation in years.  Denies pedal edema or unilateral leg swelling.  Denies recent travel or prolonged immobilization. Never smoker. ? ?HPI ? ?Past Medical History:  ?Diagnosis Date  ? Hyperlipidemia   ? Hypertension   ? PE (pulmonary thromboembolism) (HCC)   ? ? ?Patient Active Problem List  ? Diagnosis Date Noted  ? Lower GI bleed 07/03/2018  ? GI bleed 07/02/2018  ? Prediabetes 07/19/2015  ? Hx pulmonary embolism 07/18/2015  ? Hyperlipidemia 08/15/2014  ? Essential hypertension 08/15/2014  ? Arthritis 08/15/2014  ? ? ?Past Surgical History:  ?Procedure Laterality Date  ? NO PAST SURGERIES    ? VISCERAL ANGIOGRAPHY N/A 07/02/2018  ? Procedure: VISCERAL ANGIOGRAPHY with possible embolization;  Surgeon: Annice Needy, MD;  Location: [facility];  Service: Cardiovascular;  Laterality: N/A;  ? ? ? ? ? ?Home Medications   ? ?Prior to Admission medications   ?Medication Sig Start Date End Date Taking? Authorizing Provider  ?ascorbic acid (VITAMIN C) 500 MG tablet Take 500 mg by mouth daily.    [provider]  ?atorvastatin (LIPITOR) 10 MG tablet Take 10 mg by mouth daily.    [provider]  ?benazepril-hydrochlorthiazide (LOTENSIN HCT) 5-6.25 MG tablet Take 1 tablet by mouth daily.    [provider]  ?Cholecalciferol (VITAMIN D3) 25 MCG (1000 UT) CAPS  Take by mouth.    [provider]  ?diclofenac Sodium (VOLTAREN) 1 % GEL Apply 4 g topically 4 (four) times daily as needed. 12/19/19   Hilts, Casimiro Needle, MD  ?Multiple Vitamins-Minerals (CENTRUM SILVER 50+MEN) TABS Take 1 tablet by mouth daily.    [provider]  ?tiZANidine (ZANAFLEX) 2 MG tablet Take 1-2 tablets (2-4 mg total) by mouth at bedtime as needed for muscle spasms. ?Patient not taking: Reported on 12/19/2019 08/22/18   Hilts, Casimiro Needle, MD  ?Zinc 50 MG TABS Take by mouth daily.    [provider]  ? ? ?Family History ?Family History  ?Problem Relation Age of Onset  ? Diabetes Mother   ? Lung cancer Father   ? ? ?Social History ?Social History  ? ?Tobacco Use  ? Smoking status: Never  ? Smokeless tobacco: Never  ?Vaping Use  ? Vaping Use: Never used  ?Substance Use Topics  ? Alcohol use: No  ?  Alcohol/week: 0.0 standard drinks  ? Drug use: No  ? ? ? ?Allergies   ?Contrast media [iodinated contrast media] ? ? ?Review of Systems ?Review of Systems  ?Constitutional:  Negative for appetite change, chills and fever.  ?HENT:  Positive for congestion. Negative for ear pain, rhinorrhea, sinus pressure, sinus pain and sore throat.   ?Eyes:  Negative for redness and visual disturbance.  ?Respiratory:  Positive for cough and shortness of breath. Negative for chest  tightness and wheezing.   ?Cardiovascular:  Negative for chest pain and palpitations.  ?Gastrointestinal:  Negative for abdominal pain, constipation, diarrhea, nausea and vomiting.  ?Genitourinary:  Negative for dysuria, frequency and urgency.  ?Musculoskeletal:  Negative for myalgias.  ?Neurological:  Negative for dizziness, weakness and headaches.  ?Psychiatric/Behavioral:  Negative for confusion.   ?All other systems reviewed and are negative. ? ? ?Physical Exam ?Triage Vital Signs ?ED Triage Vitals [05/17/21 1227]  ?Enc Vitals Group  ?   BP (!) 141/126  ?   Pulse Rate (!) 108  ?   Resp 18  ?   Temp (!) 100.4 ?F (38 ?C)  ?   Temp  Source Oral  ?   SpO2 98 %  ?   Weight   ?   Height   ?   Head Circumference   ?   Peak Flow   ?   Pain Score 0  ?   Pain Loc   ?   Pain Edu?   ?   Excl. in GC?   ? ?No data found. ? ?Updated Vital Signs ?BP (!) 141/126 (BP Location: Left Arm)   Pulse (!) 108   Temp (!) 100.4 ?F (38 ?C) (Oral)   Resp 18   SpO2 98%  ? ?Visual Acuity ?Right Eye Distance:   ?Left Eye Distance:   ?Bilateral Distance:   ? ?Right Eye Near:   ?Left Eye Near:    ?Bilateral Near:    ? ?Physical Exam ?Vitals reviewed.  ?Constitutional:   ?   General: He is not in acute distress. ?   Appearance: Normal appearance. He is not ill-appearing.  ?HENT:  ?   Head: Normocephalic and atraumatic.  ?   Right Ear: Tympanic membrane, ear canal and external ear normal. No tenderness. No middle ear effusion. There is no impacted cerumen. Tympanic membrane is not perforated, erythematous, retracted or bulging.  ?   Left Ear: Tympanic membrane, ear canal and external ear normal. No tenderness.  No middle ear effusion. There is no impacted cerumen. Tympanic membrane is not perforated, erythematous, retracted or bulging.  ?   Nose: Nose normal. No congestion.  ?   Mouth/Throat:  ?   Mouth: Mucous membranes are moist.  ?   Pharynx: Uvula midline. No oropharyngeal exudate or posterior oropharyngeal erythema.  ?Eyes:  ?   Extraocular Movements: Extraocular movements intact.  ?   Pupils: Pupils are equal, round, and reactive to light.  ?Cardiovascular:  ?   Rate and Rhythm: Regular rhythm. Tachycardia present.  ?   Heart sounds: Normal heart sounds.  ?Pulmonary:  ?   Effort: Pulmonary effort is normal.  ?   Breath sounds: Rhonchi present. No decreased breath sounds, wheezing or rales.  ?   Comments: Rhonchi bilateral lower lung fields  ?Abdominal:  ?   Palpations: Abdomen is soft.  ?   Tenderness: There is no abdominal tenderness. There is no guarding or rebound.  ?Lymphadenopathy:  ?   Cervical: No cervical adenopathy.  ?   Right cervical: No superficial  cervical adenopathy. ?   Left cervical: No superficial cervical adenopathy.  ?Neurological:  ?   General: No focal deficit present.  ?   Mental Status: He is alert and oriented to person, place, and time.  ?Psychiatric:     ?   Mood and Affect: Mood normal.     ?   Behavior: Behavior normal.     ?   Thought Content: Thought content normal.     ?  Judgment: Judgment normal.  ? ? ? ?UC Treatments / Results  ?Labs ?(all labs ordered are listed, but only abnormal results are displayed) ?Labs Reviewed  ?RESP PANEL BY RT-PCR (FLU A&B, COVID) [id number]  ? ? ?EKG ? ? ?Radiology ?DG Chest 2 View ? ?Result Date: 05/17/2021 ?CLINICAL DATA:  Cough and fever. EXAM: CHEST - 2 VIEW COMPARISON:  12/05/2005 FINDINGS: Focal ill-defined airspace disease identified parahilar right lung. Left lung clear. Tiny right pleural effusion with no left pleural effusion. No pulmonary edema. The cardiopericardial silhouette is within normal limits for size. The visualized bony structures of the thorax are unremarkable. IMPRESSION: Focal ill-defined airspace disease parahilar right lung compatible with pneumonia. Associated tiny right pleural effusion. Close follow-up recommended to ensure complete resolution as central right lung neoplasm not entirely excluded. Electronically Signed   By: Kennith Center M.D.   On: 05/17/2021 12:51   ? ?Procedures ?Procedures (including critical care time) ? ?Medications Ordered in UC ?Medications  ?acetaminophen (TYLENOL) tablet 650 mg (650 mg Oral Given 05/17/21 1234)  ? ? ?Initial Impression / Assessment and Plan / UC Course  ?I have reviewed the triage vital signs and the nursing notes. ? ?Pertinent labs & imaging results that were available during my care of the patient were reviewed by me and considered in my medical decision making (see chart for details). ? ?  ? ?This patient is a very pleasant 76 y.o. year old male presenting with R lobe pneumonia.  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 Wells score 3 given history. PERC score 3 given history and age.  ? ?EKG with sinus tachy, otherwise NSR. No prior study for comparison. ? ?CXR - Focal ill-defined airspace disease parahilar right lung c

## 2021-05-17 NOTE — ED Notes (Addendum)
See triage note. Pt reports non-productive cough; PNA dx while at urgent care today; PE history; looking to get imaging done today to rule out PE; resp reg/unlabored; skin dry; resting calmly on stretcher; HTN history; was able to take BP meds today; reports BP normally about 130/80; denies CP or SOB. Family at bedside. Confirms major allergy to contrast; states it caused him to code in the past.  ?

## 2021-05-17 NOTE — ED Provider Notes (Signed)
? ?[facility] ?Provider Note ? ? ? Event Date/Time  ? First MD Initiated Contact with Patient 05/17/21 1453   ?  (approximate) ? ? ?History  ? ?Cough ? ? ?HPI ? ?Kevin Acosta is a 76 y.o. male with past medical history of hypertension and remote PE who presents for evaluation of 2 to 3 weeks of mild nonproductive cough associate with right-sided chest discomfort yesterday and slight shortness of breath.  Patient seen in urgent care earlier today and sent to emergency room to rule out a PE given he reported some right-sided chest discomfort.  Endorses subjective fevers the last 2 days.  No chest pain today.  No earache, sore throat, nausea, vomiting, diarrhea, abdominal pain, back pain, left-sided chest pain rash or extremity pain.  No recent EtOH use illicit drugs or tobacco abuse.  Patient is on any blood thinners.  No other acute concerns at this time. ? ?  ? ? ?Physical Exam  ?Triage Vital Signs: ?ED Triage Vitals [05/17/21 1412]  ?Enc Vitals Group  ?   BP (!) 172/89  ?   Pulse Rate (!) 104  ?   Resp 17  ?   Temp 97.9 ?F (36.6 ?C)  ?   Temp Source Oral  ?   SpO2 96 %  ?   Weight 150 lb (68 kg)  ?   Height 5\' 10"  (1.778 m)  ?   Head Circumference   ?   Peak Flow   ?   Pain Score   ?   Pain Loc   ?   Pain Edu?   ?   Excl. in GC?   ? ? ?Most recent vital signs: ?Vitals:  ? 05/17/21 1445 05/17/21 1500  ?BP:  (!) 150/74  ?Pulse:  90  ?Resp:    ?Temp:    ?SpO2: 95%   ? ? ?General: Awake, no distress.  ?CV:  Good peripheral perfusion.  2+ radial pulse. ?Resp:  Normal effort.  Clear bilaterally. ?Abd:  No distention.  Soft throughout. ?Other:  No significant lower extremity edema. ? ? ?ED Results / Procedures / Treatments  ?Labs ?(all labs ordered are listed, but only abnormal results are displayed) ?Labs Reviewed  ?CBC WITH DIFFERENTIAL/PLATELET - Abnormal; Notable for the following components:  ?    Result Value  ? WBC 12.7 (*)   ? Neutro Abs 10.4 (*)   ? Monocytes Absolute 1.2 (*)   ? All  other components within normal limits  ?COMPREHENSIVE METABOLIC PANEL - Abnormal; Notable for the following components:  ? Sodium 133 (*)   ? Glucose, Bld 149 (*)   ? Total Bilirubin 1.5 (*)   ? All other components within normal limits  ?D-DIMER, QUANTITATIVE - Abnormal; Notable for the following components:  ? D-Dimer, Quant 0.56 (*)   ? All other components within normal limits  ?TROPONIN I (HIGH SENSITIVITY)  ? ? ? ?EKG ? ?ECG obtained earlier today reviewed by myself shows sinus tachycardia with a ventricular rate of 111, normal axis, unremarkable intervals with some nonspecific changes with artifact in V2 without other clear evidence of acute ischemia or significant arrhythmia. ? ?RADIOLOGY ? ?Chest x-ray obtained earlier today reviewed by myself shows right perihilar opacity concerning for pneumonia without any other clear focal consolidations, effusion, edema, pneumothorax or other clear acute thoracic process.  As reviewed radiology interpretation. ? ? ?PROCEDURES: ? ?Critical Care performed: No ? ?.1-3 Lead EKG Interpretation ?Performed by: 07/17/21, MD ?Authorized by:  Gilles Chiquito, MD  ? ?  Interpretation: normal   ?  ECG rate assessment: normal   ?  Rhythm: sinus rhythm   ?  Ectopy: none   ?  Conduction: normal   ? ?The patient is on the cardiac monitor to evaluate for evidence of arrhythmia and/or significant heart rate changes. ? ? ?MEDICATIONS ORDERED IN ED: ?Medications  ?doxycycline (VIBRA-TABS) tablet 100 mg (has no administration in time range)  ? ? ? ?IMPRESSION / MDM / ASSESSMENT AND PLAN / ED COURSE  ?I reviewed the triage vital signs and the nursing notes. ?             ?               ? ?Differential diagnosis includes, but is not limited to pneumonia, bronchitis, symptomatic pleural effusion, PE, ACS, myocarditis, arrhythmia anemia and metabolic derangements. ? ?ECG obtained earlier today reviewed by myself shows sinus tachycardia with a ventricular rate of 111, normal axis,  unremarkable intervals with some nonspecific changes with artifact in V2 without other clear evidence of acute ischemia or significant arrhythmia.  Nonelevated troponin is not suggestive of atypical ACS. ? ?Chest x-ray obtained earlier today reviewed by myself shows right perihilar opacity concerning for pneumonia without any other clear focal consolidations, effusion, edema, pneumothorax or other clear acute thoracic process.  As reviewed radiology interpretation. ? ?COVID and influenza PCR is negative.  CBC shows WBC count of 12.7 without evidence of acute anemia and normal platelets.  CMP without significant electrolyte or metabolic derangements. ? ?D-dimer 0.56 is within age-adjusted limits and overall given her consolidation on chest x-ray with patient reporting cough and subjective fevers, stronger suspicion for kidney acquired pneumonia and a PE at this time.  I do not believe she is septic and has no evidence of respiratory failure.  I think he is safe for discharge with outpatient follow-up.  We will treat with a course of doxycycline.  Discussed importance of follow-up with PCP for repeat chest x-ray to ensure resolution and exclude malignancy.  Also advised to have his blood pressure rechecked.  Discharged in stable condition.  Strict return precautions advised and discussed. ?  ? ? ?FINAL CLINICAL IMPRESSION(S) / ED DIAGNOSES  ? ?Final diagnoses:  ?Pneumonia of right middle lobe due to infectious organism  ?Hypertension, unspecified type  ? ? ? ?Rx / DC Orders  ? ?ED Discharge Orders   ? ?      Ordered  ?  doxycycline (VIBRAMYCIN) 100 MG capsule  2 times daily       ? 05/17/21 1611  ? ?  ?  ? ?  ? ? ? ?Note:  This document was prepared using Dragon voice recognition software and may include unintentional dictation errors. ?  ?Gilles Chiquito, MD ?05/17/21 1612 ? ?

## 2021-05-17 NOTE — ED Triage Notes (Signed)
Pt c/o non productive cough for the past 10 days and started to run a fever in the past couple of days, was seem at urgent care today  Dx with PNE and referred to the ED for CT to r/o PE due to the hx of PE in the past, pt had chest xray, ekg and covid swab completed  ?

## 2021-05-18 ENCOUNTER — Ambulatory Visit: Payer: Self-pay

## 2021-09-04 ENCOUNTER — Encounter: Payer: Self-pay | Admitting: Emergency Medicine

## 2021-09-04 ENCOUNTER — Ambulatory Visit
Admission: EM | Admit: 2021-09-04 | Discharge: 2021-09-04 | Disposition: A | Discharge status: Home / Self Care | Payer: Medicare PPO

## 2021-09-04 ENCOUNTER — Ambulatory Visit (INDEPENDENT_AMBULATORY_CARE_PROVIDER_SITE_OTHER): Payer: Medicare PPO

## 2021-09-04 DIAGNOSIS — J189 Pneumonia, unspecified organism: Secondary | ICD-10-CM | POA: Diagnosis not present

## 2021-09-04 DIAGNOSIS — R079 Chest pain, unspecified: Secondary | ICD-10-CM | POA: Diagnosis not present

## 2021-09-04 DIAGNOSIS — R0789 Other chest pain: Secondary | ICD-10-CM

## 2021-09-04 NOTE — ED Provider Notes (Signed)
[facility]    CSN: [id number] Arrival date & time: 09/04/21  1450      History   Chief Complaint Chief Complaint  Patient presents with   Chest Pain    HPI YOSHIO SELIGA is a 76 y.o. male.   Patient is a 76 year old male who presents with chief complaint of central chest pain upon inhalation as well as an sensation of something possibly in the back of his throat.  Patient reports a dry cough with no chest pain with coughing.  Stated started this a.m.  Patient denies any shortness of breath.  No chest pain with arm movements and the pain does not move to his arm or jaw.  Patient was treated for pneumonia back in April but 6-week posttreatment x-ray was clear with resolution of his pneumonia.  Patient does report doing quite a bit of yard work due to a garden.    Past Medical History:  Diagnosis Date   Hyperlipidemia    Hypertension    PE (pulmonary thromboembolism) (HCC)     Patient Active Problem List   Diagnosis Date Noted   Lower GI bleed 07/03/2018   GI bleed 07/02/2018   Prediabetes 07/19/2015   Hx pulmonary embolism 07/18/2015   Hyperlipidemia 08/15/2014   Essential hypertension 08/15/2014   Arthritis 08/15/2014    Past Surgical History:  Procedure Laterality Date   NO PAST SURGERIES     VISCERAL ANGIOGRAPHY N/A 07/02/2018   Procedure: VISCERAL ANGIOGRAPHY with possible embolization;  Surgeon: Annice Needy, MD;  Location: [facility] INVASIVE CV [facility];  Service: Cardiovascular;  Laterality: N/A;       Home Medications    Prior to Admission medications   Medication Sig Start Date End Date Taking? Authorizing Provider  ascorbic acid (VITAMIN C) 500 MG tablet Take 500 mg by mouth daily.   Yes [provider]  atorvastatin (LIPITOR) 10 MG tablet Take 10 mg by mouth daily.   Yes [provider]  benazepril-hydrochlorthiazide (LOTENSIN HCT) 5-6.25 MG tablet Take 1 tablet by mouth daily.   Yes [provider]  Cholecalciferol  (VITAMIN D3) 25 MCG (1000 UT) CAPS Take by mouth.   Yes [provider]  diclofenac Sodium (VOLTAREN) 1 % GEL Apply 4 g topically 4 (four) times daily as needed. 12/19/19  Yes Hilts, Casimiro Needle, MD  Multiple Vitamins-Minerals (CENTRUM SILVER 50+MEN) TABS Take 1 tablet by mouth daily.   Yes [provider]  Omega-3 Fatty Acids (FISH OIL OMEGA-3 PO) Take by mouth.   Yes [provider]  Zinc 50 MG TABS Take by mouth daily.   Yes [provider]  tiZANidine (ZANAFLEX) 2 MG tablet Take 1-2 tablets (2-4 mg total) by mouth at bedtime as needed for muscle spasms. Patient not taking: Reported on 12/19/2019 08/22/18   Hilts, Casimiro Needle, MD    Family History Family History  Problem Relation Age of Onset   Diabetes Mother    Lung cancer Father     Social History Social History   Tobacco Use   Smoking status: Never   Smokeless tobacco: Never  Vaping Use   Vaping Use: Never used  Substance Use Topics   Alcohol use: Yes    Comment: social   Drug use: No     Allergies   Contrast media [iodinated contrast media]   Review of Systems Review of Systems    Physical Exam Triage Vital Signs ED Triage Vitals  Enc Vitals Group     BP 09/04/21 1457 Marland Kitchen)  172/95     Pulse Rate 09/04/21 1457 (!) 102     Resp 09/04/21 1457 16     Temp 09/04/21 1457 98.6 F (37 C)     Temp Source 09/04/21 1457 Oral     SpO2 09/04/21 1457 100 %     Weight --      Height --      Head Circumference --      Peak Flow --      Pain Score 09/04/21 1454 3     Pain Loc --      Pain Edu? --      Excl. in GC? --    No data found.  Updated Vital Signs BP (!) 172/95 (BP Location: Left Arm)   Pulse (!) 102   Temp 98.6 F (37 C) (Oral)   Resp 16   SpO2 100%    Physical Exam Constitutional:      General: He is not in acute distress.    Appearance: He is well-developed.  Cardiovascular:     Rate and Rhythm: Normal rate and regular rhythm.     Heart sounds: Normal heart sounds.   Pulmonary:     Effort: Pulmonary effort is normal. No tachypnea or respiratory distress.     Breath sounds: Normal breath sounds.  Chest:     Chest wall: No tenderness (no tenderness to palpation).  Musculoskeletal:     Right lower leg: No edema.     Left lower leg: No edema.  Neurological:     General: No focal deficit present.     Mental Status: He is alert and oriented to person, place, and time.      UC Treatments / Results  Labs (all labs ordered are listed, but only abnormal results are displayed) Labs Reviewed - No data to display   EKG ST at 102. No ST changes. Qtc 440.   Radiology DG Chest 2 View  Result Date: 09/04/2021 CLINICAL DATA:  Fall pneumonia EXAM: CHEST - 2 VIEW COMPARISON:  05/17/2021 FINDINGS: The heart size and mediastinal contours are within normal limits. Interval resolution of previously seen right perihilar airspace opacity. Both lungs are clear. The visualized skeletal structures are unremarkable. IMPRESSION: No active cardiopulmonary disease. Interval resolution of previously seen right perihilar airspace opacity. Electronically Signed   By: Duanne Guess D.O.   On: 09/04/2021 15:56    Procedures Procedures (including critical care time)  Medications Ordered in UC Medications - No data to display  Initial Impression / Assessment and Plan / UC Course  I have reviewed the triage vital signs and the nursing notes.  Pertinent labs & imaging results that were available during my care of the patient were reviewed by me and considered in my medical decision making (see chart for details).    Patient presents with complaint of empiric chest pain with inhalation but not cough.  Pay states he does work frequently in the garden.  No tenderness to palpation.  Patient does have a history of remote PE about 20 years ago has not been on anticoagulation.  Patient denies any shortness of breath or radiating chest pain.  Well score of 3 mostly related to his  history as well as the heart rate of 102.  Feel less likely PE given his presentation.  Chest x-ray ordered but negative for any acute findings.  Again, no acute findings on chest or cardiac EKG.  No tenderness palpation and no pain with cough.  Recommend patient try over-the-counter pain  medication for the next day or so or if symptoms worsen we will have him be seen in the ER or contact his primary care. Final Clinical Impressions(s) / UC Diagnoses   Final diagnoses:  Atypical chest pain     Discharge Instructions      -Chest x-ray without any acute changes or any acute concerns for heart or lung processes. -Can try ibuprofen or Tylenol as needed for pain -EKG within normal limits with no acute changes. -Again, definitely cannot rule out any PE without a CT angiogram of the chest but low concern giving no ongoing shortness of breath or continuous chest pain.  As well as only minimal tachycardia on her EKG.  Should symptoms worsen or become more short of breath or chest pain worsen, would proceed to the ER for further evaluation.     ED Prescriptions   None    PDMP not reviewed this encounter.   Candis Schatz, PA-C 09/04/21 [phone]

## 2021-09-04 NOTE — Discharge Instructions (Signed)
-  Chest x-ray without any acute changes or any acute concerns for heart or lung processes. -Can try ibuprofen or Tylenol as needed for pain -EKG within normal limits with no acute changes. -Again, definitely cannot rule out any PE without a CT angiogram of the chest but low concern giving no ongoing shortness of breath or continuous chest pain.  As well as only minimal tachycardia on her EKG.  Should symptoms worsen or become more short of breath or chest pain worsen, would proceed to the ER for further evaluation.

## 2021-09-04 NOTE — ED Triage Notes (Addendum)
Pt c/o pain in his chest during inhalation. He also has discomfort around his neck symptoms started this morning. Pt denies HA, dizziness, SOB or pain in his extremities.

## 2021-09-07 ENCOUNTER — Other Ambulatory Visit: Payer: Self-pay

## 2021-09-07 ENCOUNTER — Observation Stay: Payer: Medicare PPO

## 2021-09-07 ENCOUNTER — Encounter: Payer: Self-pay | Admitting: Emergency Medicine

## 2021-09-07 ENCOUNTER — Emergency Department: Payer: Medicare PPO

## 2021-09-07 ENCOUNTER — Observation Stay
Admission: EM | Admit: 2021-09-07 | Discharge: 2021-09-08 | Disposition: A | Discharge status: Home / Self Care | Payer: Medicare PPO | Attending: Internal Medicine | Admitting: Internal Medicine

## 2021-09-07 DIAGNOSIS — R0789 Other chest pain: Secondary | ICD-10-CM | POA: Diagnosis not present

## 2021-09-07 DIAGNOSIS — Z20822 Contact with and (suspected) exposure to covid-19: Secondary | ICD-10-CM | POA: Insufficient documentation

## 2021-09-07 DIAGNOSIS — R7303 Prediabetes: Secondary | ICD-10-CM | POA: Diagnosis present

## 2021-09-07 DIAGNOSIS — Z0389 Encounter for observation for other suspected diseases and conditions ruled out: Secondary | ICD-10-CM | POA: Diagnosis not present

## 2021-09-07 DIAGNOSIS — R0781 Pleurodynia: Principal | ICD-10-CM | POA: Insufficient documentation

## 2021-09-07 DIAGNOSIS — R079 Chest pain, unspecified: Secondary | ICD-10-CM | POA: Diagnosis present

## 2021-09-07 DIAGNOSIS — Z86711 Personal history of pulmonary embolism: Secondary | ICD-10-CM | POA: Diagnosis not present

## 2021-09-07 DIAGNOSIS — D72829 Elevated white blood cell count, unspecified: Secondary | ICD-10-CM | POA: Diagnosis not present

## 2021-09-07 DIAGNOSIS — E785 Hyperlipidemia, unspecified: Secondary | ICD-10-CM | POA: Diagnosis present

## 2021-09-07 DIAGNOSIS — R7989 Other specified abnormal findings of blood chemistry: Secondary | ICD-10-CM | POA: Diagnosis not present

## 2021-09-07 DIAGNOSIS — I10 Essential (primary) hypertension: Secondary | ICD-10-CM | POA: Diagnosis not present

## 2021-09-07 DIAGNOSIS — Z79899 Other long term (current) drug therapy: Secondary | ICD-10-CM | POA: Insufficient documentation

## 2021-09-07 DIAGNOSIS — R0602 Shortness of breath: Secondary | ICD-10-CM | POA: Diagnosis not present

## 2021-09-07 LAB — APTT: aPTT: 29 seconds (ref 24–36)

## 2021-09-07 LAB — D-DIMER, QUANTITATIVE: D-Dimer, Quant: 0.98 ug/mL-FEU — ABNORMAL HIGH (ref 0.00–0.50)

## 2021-09-07 LAB — HEMOGLOBIN A1C
Hgb A1c MFr Bld: 5.8 % — ABNORMAL HIGH (ref 4.8–5.6)
Mean Plasma Glucose: 119.76 mg/dL

## 2021-09-07 LAB — BASIC METABOLIC PANEL
Anion gap: 8 (ref 5–15)
BUN: 19 mg/dL (ref 8–23)
CO2: 24 mmol/L (ref 22–32)
Calcium: 8.9 mg/dL (ref 8.9–10.3)
Chloride: 103 mmol/L (ref 98–111)
Creatinine, Ser: 0.82 mg/dL (ref 0.61–1.24)
GFR, Estimated: >60 mL/min (ref >60)
Glucose, Bld: 235 mg/dL — ABNORMAL HIGH (ref 70–99)
Potassium: 4.6 mmol/L (ref 3.5–5.1)
Sodium: 135 mmol/L (ref 135–145)

## 2021-09-07 LAB — CBG MONITORING, ED: Glucose-Capillary: 146 mg/dL — ABNORMAL HIGH (ref 70–99)

## 2021-09-07 LAB — SEDIMENTATION RATE: Sed Rate: 28 mm/hr — ABNORMAL HIGH (ref 0–20)

## 2021-09-07 LAB — CBC
HCT: 45.4 % (ref 39.0–52.0)
Hemoglobin: 14.8 g/dL (ref 13.0–17.0)
MCH: 30.2 pg (ref 26.0–34.0)
MCHC: 32.6 g/dL (ref 30.0–36.0)
MCV: 92.7 fL (ref 80.0–100.0)
Platelets: 168 10*3/uL (ref 150–400)
RBC: 4.9 MIL/uL (ref 4.22–5.81)
RDW: 13.4 % (ref 11.5–15.5)
WBC: 13.4 10*3/uL — ABNORMAL HIGH (ref 4.0–10.5)
nRBC: 0 % (ref 0.0–0.2)

## 2021-09-07 LAB — GLUCOSE, CAPILLARY
Glucose-Capillary: 124 mg/dL — ABNORMAL HIGH (ref 70–99)
Glucose-Capillary: 147 mg/dL — ABNORMAL HIGH (ref 70–99)

## 2021-09-07 LAB — TROPONIN I (HIGH SENSITIVITY)
Troponin I (High Sensitivity): 3 ng/L (ref <18)
Troponin I (High Sensitivity): <2 ng/L (ref <18)
Troponin I (High Sensitivity): <2 ng/L (ref <18)

## 2021-09-07 LAB — PROTIME-INR
INR: 1.2 (ref 0.8–1.2)
Prothrombin Time: 14.9 seconds (ref 11.4–15.2)

## 2021-09-07 LAB — C-REACTIVE PROTEIN: CRP: 13.3 mg/dL — ABNORMAL HIGH (ref <1.0)

## 2021-09-07 LAB — SARS CORONAVIRUS 2 BY RT PCR: SARS Coronavirus 2 by RT PCR: NEGATIVE

## 2021-09-07 MED ORDER — ONDANSETRON HCL 4 MG/2ML IJ SOLN: 4.0000 mg | Freq: Three times a day (TID) | INTRAMUSCULAR | Status: DC | PRN

## 2021-09-07 MED ORDER — HEPARIN BOLUS VIA INFUSION
4000.0000 [IU] | Freq: Once | INTRAVENOUS | Status: DC
  Filled 2021-09-07: qty 4000

## 2021-09-07 MED ORDER — VITAMIN D 25 MCG (1000 UNIT) PO TABS
1000.0000 [IU] | ORAL_TABLET | Freq: Every day | ORAL | Status: DC
  Administered 2021-09-08: 1000 [IU] via ORAL
  Filled 2021-09-07: qty 1

## 2021-09-07 MED ORDER — INSULIN ASPART 100 UNIT/ML IJ SOLN
0.0000 [IU] | Freq: Three times a day (TID) | INTRAMUSCULAR | Status: DC
  Administered 2021-09-07: 1 [IU] via SUBCUTANEOUS
  Filled 2021-09-07 (×2): qty 1

## 2021-09-07 MED ORDER — INSULIN ASPART 100 UNIT/ML IJ SOLN: 0.0000 [IU] | Freq: Every day | INTRAMUSCULAR | Status: DC

## 2021-09-07 MED ORDER — DM-GUAIFENESIN ER 30-600 MG PO TB12: 1.0000 | ORAL_TABLET | Freq: Two times a day (BID) | ORAL | Status: DC | PRN

## 2021-09-07 MED ORDER — HYDRALAZINE HCL 20 MG/ML IJ SOLN
5.0000 mg | INTRAMUSCULAR | Status: DC | PRN
Start: 2021-09-07 — End: 2021-09-08

## 2021-09-07 MED ORDER — ACETAMINOPHEN 325 MG PO TABS: 650.0000 mg | ORAL_TABLET | Freq: Four times a day (QID) | ORAL | Status: DC | PRN

## 2021-09-07 MED ORDER — OMEGA-3-ACID ETHYL ESTERS 1 G PO CAPS
1.0000 g | ORAL_CAPSULE | Freq: Every day | ORAL | Status: DC
Start: 2021-09-07 — End: 2021-09-08
  Administered 2021-09-07 – 2021-09-08 (×2): 1 g via ORAL
  Filled 2021-09-07 (×2): qty 1

## 2021-09-07 MED ORDER — TECHNETIUM TO 99M ALBUMIN AGGREGATED
4.3200 | Freq: Once | INTRAVENOUS | Status: AC | PRN
  Administered 2021-09-07: 4.32 via INTRAVENOUS

## 2021-09-07 MED ORDER — MORPHINE SULFATE (PF) 2 MG/ML IV SOLN: 1.0000 mg | INTRAVENOUS | Status: DC | PRN

## 2021-09-07 MED ORDER — ADULT MULTIVITAMIN W/MINERALS CH
1.0000 | ORAL_TABLET | Freq: Every day | ORAL | Status: DC
  Administered 2021-09-08: 1 via ORAL
  Filled 2021-09-07: qty 1

## 2021-09-07 MED ORDER — ENOXAPARIN SODIUM 40 MG/0.4ML IJ SOSY
40.0000 mg | PREFILLED_SYRINGE | INTRAMUSCULAR | Status: DC
  Administered 2021-09-07: 40 mg via SUBCUTANEOUS
  Filled 2021-09-07: qty 0.4

## 2021-09-07 MED ORDER — HEPARIN (PORCINE) 25000 UT/250ML-% IV SOLN: 850.0000 [IU]/h | INTRAVENOUS | Status: DC

## 2021-09-07 MED ORDER — COLCHICINE 0.6 MG PO TABS
0.6000 mg | ORAL_TABLET | Freq: Two times a day (BID) | ORAL | Status: DC
  Administered 2021-09-07 – 2021-09-08 (×2): 0.6 mg via ORAL
  Filled 2021-09-07 (×3): qty 1

## 2021-09-07 MED ORDER — KETOROLAC TROMETHAMINE 30 MG/ML IJ SOLN
15.0000 mg | Freq: Once | INTRAMUSCULAR | Status: AC
  Administered 2021-09-07: 15 mg via INTRAVENOUS
  Filled 2021-09-07: qty 1

## 2021-09-07 MED ORDER — NITROGLYCERIN 0.4 MG SL SUBL: 0.4000 mg | SUBLINGUAL_TABLET | SUBLINGUAL | Status: DC | PRN

## 2021-09-07 MED ORDER — BENAZEPRIL HCL 5 MG PO TABS
5.0000 mg | ORAL_TABLET | Freq: Every day | ORAL | Status: DC
  Administered 2021-09-08: 5 mg via ORAL
  Filled 2021-09-07: qty 1

## 2021-09-07 MED ORDER — SODIUM CHLORIDE 0.9 % IV SOLN: INTRAVENOUS | Status: DC

## 2021-09-07 MED ORDER — ALBUTEROL SULFATE (2.5 MG/3ML) 0.083% IN NEBU: 3.0000 mL | INHALATION_SOLUTION | RESPIRATORY_TRACT | Status: DC | PRN

## 2021-09-07 NOTE — ED Triage Notes (Signed)
Pt reports chest pain to the center of his chest that radiates through to his back. Pt reports the pain is sharp in nature and constant and he feels like he cannot breathe well. Pt reports same sx's on Thursday so he went to the walk in clinic and they did an x-ray and ekg and told him it looked fine. Pt reports at that time the sx's subsided so they told him if it came back to go to the ED. Pt reports this episode started last pm and has not gotten any better.

## 2021-09-07 NOTE — ED Notes (Signed)
ED Provider at bedside. 

## 2021-09-07 NOTE — H&P (Signed)
History and Physical    Kevin Acosta RZN:[id number] DOB: [birth year] DOA: 09/07/2021  Referring MD/NP/PA:   PCP: Sofie Hartigan, MD   Patient coming from:  The patient is coming from home  Chief Complaint: chest pain  HPI: Kevin Acosta is a 76 y.o. 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  Patient states that he has chest pain for about 3 days, which is located in the central chest, sharp, 6 out of 10 in severity, radiation to back, pleuritic, aggravated by deep breath.  Patient has mild dry cough, no shortness of breath.  No fever or chills.  Patient was seen in walk-in clinic on Thursday and had chest x-ray and EKG which was fine for patient.  No nausea vomiting, diarrhea or abdominal pain.  No symptoms of UTI.   Data reviewed independently and ED Course: pt was found to have negative troponin x3 so far, WBC 13.3, GFR> 60, negative COVID PCR, temperature normal, blood pressure 109/67, heart rate 78, RR 20, oxygen saturation 100% on room air.  Chest x-ray negative.  D-dimer positive at 0.98.  Lower extremity venous Doppler negative for DVT.  VQ scan has low probability for PE.  Patient is placed on telemetry bed for observation.  Dr. Saralyn Pilar of cardiology is consulted.   EKG: I have personally reviewed.  Sinus rhythm, QTc 421, ST elevation in the inferior leads and mild ST elevation in precordial leads no reciprocal ST depression, PR segment elevation in aVR.   Review of Systems:   General: no fevers, chills, no body weight gain, fatigue HEENT: no blurry vision, hearing changes or sore throat Respiratory: no dyspnea, has mild coughing, no wheezing CV: has chest pain, no palpitations GI: no nausea, vomiting, abdominal pain, diarrhea, constipation GU: no dysuria, burning on urination, increased urinary frequency, hematuria  Ext: no leg edema Neuro: no  unilateral weakness, numbness, or tingling, no vision change or hearing loss Skin: no rash, no skin tear. MSK: No muscle spasm, no deformity, no limitation of range of movement in spin Heme: No easy bruising.  Travel history: No recent long distant travel.   Allergy:  Allergies  Allergen Reactions   Contrast Media [Iodinated Contrast Media] Other (See Comments)    Hypotension, Vagal, "coded"    Past Medical History:  Diagnosis Date   Hyperlipidemia    Hypertension    PE (pulmonary thromboembolism) ([place])     Past Surgical History:  Procedure Laterality Date   NO PAST SURGERIES     VISCERAL ANGIOGRAPHY N/A 07/02/2018   Procedure: VISCERAL ANGIOGRAPHY with possible embolization;  Surgeon: Algernon Huxley, MD;  Location: [facility];  Service: Cardiovascular;  Laterality: N/A;    Social History:  reports that he has never smoked. He has never used smokeless tobacco. He reports current alcohol use. He reports that he does not use drugs.  Family History:  Family History  Problem Relation Age of Onset   Diabetes Mother    Lung cancer Father      Prior to Admission medications   Medication Sig Start Date End Date Taking? Authorizing Provider  ascorbic acid (VITAMIN C) 500 MG tablet Take 500 mg by mouth daily.    [provider]  atorvastatin (LIPITOR) 10 MG tablet Take 10 mg by mouth daily.    [provider]  benazepril-hydrochlorthiazide (LOTENSIN HCT) 5-6.25 MG tablet Take 1 tablet by mouth daily.  [provider]  Cholecalciferol (VITAMIN D3) 25 MCG (1000 UT) CAPS Take by mouth.    [provider]  diclofenac Sodium (VOLTAREN) 1 % GEL Apply 4 g topically 4 (four) times daily as needed. 12/19/19   Hilts, Legrand Como, MD  Multiple Vitamins-Minerals (CENTRUM SILVER 50+MEN) TABS Take 1 tablet by mouth daily.    [provider]  Omega-3 Fatty Acids (FISH OIL OMEGA-3 PO) Take by mouth.    [provider]  tiZANidine  (ZANAFLEX) 2 MG tablet Take 1-2 tablets (2-4 mg total) by mouth at bedtime as needed for muscle spasms. Patient not taking: Reported on 12/19/2019 08/22/18   Hilts, Legrand Como, MD  Zinc 50 MG TABS Take by mouth daily.    [provider]    Physical Exam: Vitals:   09/07/21 1000 09/07/21 1200 09/07/21 1500 09/07/21 1552  BP: 104/70 122/75 124/88 116/80  Pulse: 80 85 82 94  Resp: '19 20 18 19  ' Temp:  98.7 F (37.1 C) 98.7 F (37.1 C) 98.5 F (36.9 C)  TempSrc:  Oral Oral Oral  SpO2: 98% 100% 100% 99%  Weight:      Height:       General: Not in acute distress HEENT:       Eyes: PERRL, EOMI, no scleral icterus.       ENT: No discharge from the ears and nose, no pharynx injection, no tonsillar enlargement.        Neck: No JVD, no bruit, no mass felt. Heme: No neck lymph node enlargement. Cardiac: S1/S2, RRR, No murmurs, No gallops or rubs. Respiratory: No rales, wheezing, rhonchi or rubs. GI: Soft, nondistended, nontender, no rebound pain, no organomegaly, BS present. GU: No hematuria Ext: No pitting leg edema bilaterally. 1+DP/PT pulse bilaterally. Musculoskeletal: No joint deformities, No joint redness or warmth, no limitation of ROM in spin. Skin: No rashes.  Neuro: Alert, oriented X3, cranial nerves II-XII grossly intact, moves all extremities normally.  Psych: Patient is not psychotic, no suicidal or hemocidal ideation.  Labs on Admission: I have personally reviewed following labs and imaging studies  CBC: Recent Labs  Lab 09/07/21 0753  WBC 13.4*  HGB 14.8  HCT 45.4  MCV 92.7  PLT 419   Basic Metabolic Panel: Recent Labs  Lab 09/07/21 0753  NA 135  K 4.6  CL 103  CO2 24  GLUCOSE 235*  BUN 19  CREATININE 0.82  CALCIUM 8.9   GFR: Estimated Creatinine Clearance: 78.7 mL/min (by C-G formula based on SCr of 0.82 mg/dL). Liver Function Tests: No results for input(s): "AST", "ALT", "ALKPHOS", "BILITOT", "PROT", "ALBUMIN" in the last 168 hours. No results  for input(s): "LIPASE", "AMYLASE" in the last 168 hours. No results for input(s): "AMMONIA" in the last 168 hours. Coagulation Profile: Recent Labs  Lab 09/07/21 0924  INR 1.2   Cardiac Enzymes: No results for input(s): "CKTOTAL", "CKMB", "CKMBINDEX", "TROPONINI" in the last 168 hours. BNP (last 3 results) No results for input(s): "PROBNP" in the last 8760 hours. HbA1C: Recent Labs    09/07/21 0753  HGBA1C 5.8*   CBG: Recent Labs  Lab 09/07/21 1221 09/07/21 1637  GLUCAP 146* 124*   Lipid Profile: No results for input(s): "CHOL", "HDL", "LDLCALC", "TRIG", "CHOLHDL", "LDLDIRECT" in the last 72 hours. Thyroid Function Tests: No results for input(s): "TSH", "T4TOTAL", "FREET4", "T3FREE", "THYROIDAB" in the last 72 hours. Anemia Panel: No results for input(s): "VITAMINB12", "FOLATE", "FERRITIN", "TIBC", "IRON", "RETICCTPCT" in the last 72 hours. Urine analysis: No results found for: "  COLORURINE", "APPEARANCEUR", "LABSPEC", "PHURINE", "GLUCOSEU", "HGBUR", "BILIRUBINUR", "KETONESUR", "PROTEINUR", "UROBILINOGEN", "NITRITE", "LEUKOCYTESUR" Sepsis Labs: '@LABRCNTIP' (procalcitonin:4,lacticidven:4) ) Recent Results (from the past 240 hour(s))  SARS Coronavirus 2 by RT PCR (hospital order, performed in [facility] hospital lab) *cepheid single result test* Anterior Nasal Swab     Status: None   Collection Time: 09/07/21  8:25 AM   Specimen: Anterior Nasal Swab  Result Value Ref Range Status   SARS Coronavirus 2 by RT PCR NEGATIVE NEGATIVE Final    Comment: (NOTE) SARS-CoV-2 target nucleic acids are NOT DETECTED.  The SARS-CoV-2 RNA is generally detectable in upper and lower respiratory specimens during the acute phase of infection. The lowest concentration of SARS-CoV-2 viral copies this assay can detect is 250 copies / mL. A negative result does not preclude SARS-CoV-2 infection and should not be used as the sole basis for treatment or other patient management decisions.  A  negative result may occur with improper specimen collection / handling, submission of specimen other than nasopharyngeal swab, presence of viral mutation(s) within the areas targeted by this assay, and inadequate number of viral copies (<250 copies / mL). A negative result must be combined with clinical observations, patient history, and epidemiological information.  Fact Sheet for Patients:   https://www.patel.info/  Fact Sheet for Healthcare Providers: https://hall.com/  This test is not yet approved or  cleared by the [place] FDA and has been authorized for detection and/or diagnosis of SARS-CoV-2 by FDA under an Emergency Use Authorization (EUA).  This EUA will remain in effect (meaning this test can be used) for the duration of the COVID-19 declaration under Section 564(b)(1) of the Act, 21 U.S.C. section 360bbb-3(b)(1), unless the authorization is terminated or revoked sooner.  Performed at [facility], [address] [place], [place] [address]      Radiological Exams on Admission: US Venous Img Lower Bilateral (DVT)  Result Date: 09/07/2021 CLINICAL DATA:  Positive D-dimer EXAM: BILATERAL LOWER EXTREMITY VENOUS DOPPLER ULTRASOUND TECHNIQUE: Gray-scale sonography with compression, as well as color and duplex ultrasound, were performed to evaluate the deep venous system(s) from the level of the common femoral vein through the popliteal and proximal calf veins. COMPARISON:  None Available. FINDINGS: VENOUS Normal compressibility of the common femoral, superficial femoral, and popliteal veins, as well as the visualized calf veins. Visualized portions of profunda femoral vein and great saphenous vein unremarkable. No filling defects to suggest DVT on grayscale or color Doppler imaging. Doppler waveforms show normal direction of venous flow, normal respiratory plasticity and response to augmentation. Limited views of the  contralateral common femoral vein are unremarkable. OTHER None. Limitations: none IMPRESSION: Negative. Electronically Signed   By: Dorise Bullion III M.D.   On: 09/07/2021 12:59   NM Pulmonary Perfusion  Result Date: 09/07/2021 CLINICAL DATA:  Pulmonary embolism (PE) suspected, high prob EXAM: NUCLEAR MEDICINE PERFUSION LUNG SCAN TECHNIQUE: Perfusion images were obtained in multiple projections after intravenous injection of radiopharmaceutical. Ventilation scans intentionally deferred if perfusion scan and chest x-ray adequate for interpretation during COVID 19 epidemic. RADIOPHARMACEUTICALS:  4.32 mCi Tc-19mMAA IV COMPARISON:  Same day chest x-ray FINDINGS: Physiologic distribution of radiotracer throughout both lungs. No segmental or larger perfusion defect. IMPRESSION: Low probability for pulmonary embolism. Electronically Signed   By: NDavina PokeD.O.   On: 09/07/2021 11:27   DG Chest Portable 1 View  Result Date: 09/07/2021 CLINICAL DATA:  76year old male with chest pain radiating to the back. Shortness of breath. EXAM: PORTABLE CHEST 1 VIEW COMPARISON:  Chest radiographs 09/04/2021 and earlier. FINDINGS: Portable AP upright view at 0805 hours. Lower lung volumes with mild crowding of markings. Mediastinal contours remain within normal limits. Visualized tracheal air column is within normal limits. No pneumothorax, pleural effusion or confluent pulmonary opacity. No pulmonary edema suspected. Stable visualized osseous structures. Negative visible bowel gas. IMPRESSION: Lower lung volumes.  No acute cardiopulmonary abnormality. Electronically Signed   By: Genevie Ann M.D.   On: 09/07/2021 08:19      Assessment/Plan Principal Problem:   Chest pain Active Problems:   Hyperlipidemia   Essential hypertension   Prediabetes   Leukocytosis   Assessment and Plan: * Chest pain Patient has pleuritic chest pain. Troponin negative x3.  D-dimer positive, but VQ scan showed low probability for  PE.  Lower extremity venous Doppler negative for DVT.  Consulted Dr. Saralyn Pilar of cardiology.  He recommended to start colchicine empirically for possible pericarditis.  - place to tele bed for observation -Trend Trop - prn Nitroglycerin, Morphine - Colchicine 0.60 mg twice daily - Risk factor stratification: will check FLP and A1C  - f/u  2d echo -Check ESR and CRP   Hyperlipidemia Patient is not taking medications currently -Follow-up FLP  Essential hypertension - IV hydralazine as needed -Continue home Lotensin  Prediabetes Recent A1c 6.0.  Well-controlled.  Blood sugar 235 today.  Patient's not taking medications currently -Sliding scale insulin  Leukocytosis WBC 13.4.  No source of infection identified, likely reactive -Follow-up by CBC          DVT ppx: SQ Lovenox  Code Status: Full code  Family Communication: Yes, patient's wife and daughter  at bed side.   Disposition Plan:  Anticipate discharge back to previous environment  Consults called: Dr. Saralyn Pilar of cardiology   Admission status and Level of care: Telemetry Cardiac:   for obs   Severity of Illness:  The appropriate patient status for this patient is OBSERVATION. Observation status is judged to be reasonable and necessary in order to provide the required intensity of service to ensure the patient's safety. The patient's presenting symptoms, physical exam findings, and initial radiographic and laboratory data in the context of their medical condition is felt to place them at decreased risk for further clinical deterioration. Furthermore, it is anticipated that the patient will be medically stable for discharge from the hospital within 2 midnights of admission.        Date of Service 09/07/2021    [facility]   If 7PM-7AM, please contact night-coverage [email or web address] 09/07/2021, 5:43 PM

## 2021-09-07 NOTE — Assessment & Plan Note (Signed)
-   IV hydralazine as needed -Continue home Lotensin

## 2021-09-07 NOTE — Assessment & Plan Note (Signed)
WBC 13.4.  No source of infection identified, likely reactive -Follow-up by CBC

## 2021-09-07 NOTE — ED Notes (Signed)
Unit secretary asked to call Nuc Med.

## 2021-09-07 NOTE — ED Provider Notes (Signed)
[facility] Provider Note    Event Date/Time   First MD Initiated Contact with Patient 09/07/21 [phone]     (approximate)   History   Chest Pain and Shortness of Breath   HPI  Kevin Acosta is a 76 y.o. male with a remote history of PE not on anticoagulation, high cholesterol, hypertension no diabetes presents to the ER for evaluation of symptoms of mid sternal nonradiating chest pain worsened with deep inspiration that has been present for worsening over the past week.  Saw urgent care on the 27th had EKG done and chest x-ray done but no blood work was told everything looked okay.  Has had persistent and worsening symptoms including worsening chest pain this morning associated with diaphoresis the brought him to the ER today.  He did take a full dose aspirin this morning.     Physical Exam   Triage Vital Signs: ED Triage Vitals  Enc Vitals Group     BP 09/07/21 0753 126/64     Pulse Rate 09/07/21 0753 80     Resp 09/07/21 0753 20     Temp 09/07/21 0753 98.2 F (36.8 C)     Temp src --      SpO2 09/07/21 0753 100 %     Weight 09/07/21 0748 160 lb (72.6 kg)     Height 09/07/21 0748 5\' 10"  (1.778 m)     Head Circumference --      Peak Flow --      Pain Score 09/07/21 0748 8     Pain Loc --      Pain Edu? --      Excl. in GC? --     Most recent vital signs: Vitals:   09/07/21 0753 09/07/21 0800  BP: 126/64 109/67  Pulse: 80 78  Resp: 20 15  Temp: 98.2 F (36.8 C)   SpO2: 100% 100%     Constitutional: Alert  Eyes: Conjunctivae are normal.  Head: Atraumatic. Nose: No congestion/rhinnorhea. Mouth/Throat: Mucous membranes are moist.   Neck: Painless ROM.  Cardiovascular:   Good peripheral circulation. Respiratory: Normal respiratory effort.  No retractions.  Gastrointestinal: Soft and nontender.  Musculoskeletal:  no deformity Neurologic:  MAE spontaneously. No gross focal neurologic deficits are appreciated.  Skin:  Skin is warm, dry  and intact. No rash noted. Psychiatric: Mood and affect are normal. Speech and behavior are normal.    ED Results / Procedures / Treatments   Labs (all labs ordered are listed, but only abnormal results are displayed) Labs Reviewed  BASIC METABOLIC PANEL - Abnormal; Notable for the following components:      Result Value   Glucose, Bld 235 (*)    All other components within normal limits  CBC - Abnormal; Notable for the following components:   WBC 13.4 (*)    All other components within normal limits  SARS CORONAVIRUS 2 BY RT PCR  APTT  PROTIME-INR  TROPONIN I (HIGH SENSITIVITY)     EKG  ED ECG REPORT I, 09/09/21, the attending physician, personally viewed and interpreted this ECG.   Date: 09/07/2021  EKG Time: 7:51  Rate: 80  Rhythm: sinus  Axis: normal  Intervals:normal qt  ST&T Change: concave upwards st elevation in inferior leads without reciprocal depressions,  abnml ekg    RADIOLOGY Please see ED Course for my review and interpretation.  I personally reviewed all radiographic images ordered to evaluate for the above acute complaints and reviewed radiology reports  and findings.  These findings were personally discussed with the patient.  Please see medical record for radiology report.    PROCEDURES:  Critical Care performed: Yes, see critical care procedure note(s)  .Critical Care  Performed by: Willy Eddy, MD Authorized by: Willy Eddy, MD   Critical care provider statement:    Critical care time (minutes):  38   Critical care was necessary to treat or prevent imminent or life-threatening deterioration of the following conditions:  Cardiac failure   Critical care was time spent personally by me on the following activities:  Ordering and performing treatments and interventions, ordering and review of laboratory studies, ordering and review of radiographic studies, pulse oximetry, re-evaluation of patient's condition, review of old  charts, obtaining history from patient or surrogate, examination of patient, evaluation of patient's response to treatment, discussions with primary provider, discussions with consultants and development of treatment plan with patient or surrogate    MEDICATIONS ORDERED IN ED: Medications  heparin bolus via infusion 4,000 Units (has no administration in time range)  heparin ADULT infusion 100 units/mL (25000 units/218mL) (has no administration in time range)     IMPRESSION / MDM / ASSESSMENT AND PLAN / ED COURSE  I reviewed the triage vital signs and the nursing notes.                              Differential diagnosis includes, but is not limited to, ACS, pericarditis, esophagitis, boerhaaves, pe, dissection, pna, bronchitis, costochondritis   ----------------------------------------- 8:10AM on 09/07/2021 ----------------------------------------- Patient presented to the ER for evaluation of symptoms as described above.  This presenting complaint could reflect a potentially life-threatening illness therefore the patient will be placed on continuous pulse oximetry and telemetry for monitoring.  Laboratory evaluation will be sent to evaluate for the above complaints.  EKG my review is concerning but his symptoms have been present for several days he does not have reciprocal changes.  Possible pericarditis possible PE.  He is already received aspirin will order heparin.  Seems less consistent with STEMI but ACS is certainly on the differential.  Discussed case with Dr. Carlynn Purl shows of cardiology.  Given duration of symptoms and his anaphylactic reaction to contrast dye with a nondiagnostic ECG at this time will defer PCI pending further medical work-up as he is otherwise well-appearing.  He has anaphylactic reaction to contrast therefore will order VQ scan to rule out PE.  He clinically appears well at this time.    Clinical Course as of 09/07/21 9735  Wynelle Link Sep 07, 2021  0815 Chest x-ray on  my review and interpretation does not show any evidence of infiltrate or consolidation. [PR]  0846 Initial troponin is negative.  Will continue with plan for heparin as well as V/Q scan.  I do not see evidence of pneumonia.  He is afebrile.  Possible COVID-related illness.  Given age and risk factors I have consulted hospitalist for admission. [PR]    Clinical Course User Index [PR] Willy Eddy, MD    FINAL CLINICAL IMPRESSION(S) / ED DIAGNOSES   Final diagnoses:  Chest pain, unspecified type     Rx / DC Orders   ED Discharge Orders     None        Note:  This document was prepared using Dragon voice recognition software and may include unintentional dictation errors.    Willy Eddy, MD 09/07/21 [phone]

## 2021-09-07 NOTE — ED Notes (Signed)
X-ray at bedside

## 2021-09-07 NOTE — Assessment & Plan Note (Addendum)
Patient has pleuritic chest pain. Troponin negative x3.  D-dimer positive, but VQ scan showed low probability for PE.  Lower extremity venous Doppler negative for DVT.  Consulted Dr. Saralyn Pilar of cardiology.  He recommended to start colchicine empirically for possible pericarditis.  - place to tele bed for observation -Trend Trop - prn Nitroglycerin, Morphine - Colchicine 0.60 mg twice daily - Risk factor stratification: will check FLP and A1C  - f/u  2d echo -Check ESR and CRP

## 2021-09-07 NOTE — Consult Note (Signed)
[facility] Cardiology  CARDIOLOGY CONSULT NOTE  Patient ID: Kevin Acosta MRN: [id number] DOB/AGE: 03/09/1945 76 y.o.  Admit date: 09/07/2021 Referring Physician Roxan Hockey Primary Physician [facility] Primary Cardiologist  Reason for Consultation chest pain  HPI: 76 year old gentleman referred for evaluation of chest pain.  Patient has a history of ventral hypertension and hyperlipidemia.  History of pulmonary embolus in 2004.  Presents to [facility] ED with 1 week history of current chest pain.  Patient was seen on 03/07/2021 at urgent care EKG and chest x-ray were unremarkable.  The patient reports recurrent, focal, left-sided chest pain, nonexertional, precipitated by deep breaths.  I had the patient sit up and leaning forward with resolution of chest pain.  ECG revealed sinus rhythm with up scooping ST depressions in leads II, III, aVF, V3, 4 and 6, with PR depression in the same leads.  Admission labs were notable for troponin (3, less than 2).  White count was elevated at 13,400.  D-dimer was elevated 0.98.  Chest x-ray did not reveal any acute cardiopulmonary abnormality.  Chest CT was deferred due to history of severe contrast dye allergic reaction.  VQ scan pending.  Review of systems complete and found to be negative unless listed above     Past Medical History:  Diagnosis Date   Hyperlipidemia    Hypertension    PE (pulmonary thromboembolism) (HCC)     Past Surgical History:  Procedure Laterality Date   NO PAST SURGERIES     VISCERAL ANGIOGRAPHY N/A 07/02/2018   Procedure: VISCERAL ANGIOGRAPHY with possible embolization;  Surgeon: Annice Needy, MD;  Location: [facility] INVASIVE CV LAB;  Service: Cardiovascular;  Laterality: N/A;    (Not in a hospital admission)  Social History   Socioeconomic History   Marital status: Married    Spouse name: Not on file   Number of children: Not on file   Years of education: Not on file   Highest education level: Not on file  Occupational History    Not on file  Tobacco Use   Smoking status: Never   Smokeless tobacco: Never  Vaping Use   Vaping Use: Never used  Substance and Sexual Activity   Alcohol use: Yes    Comment: social   Drug use: No   Sexual activity: Not on file  Other Topics Concern   Not on file  Social History Narrative   Not on file   Social Determinants of Health   Financial Resource Strain: Not on file  Food Insecurity: Not on file  Transportation Needs: Not on file  Physical Activity: Not on file  Stress: Not on file  Social Connections: Not on file  Intimate Partner Violence: Not on file    Family History  Problem Relation Age of Onset   Diabetes Mother    Lung cancer Father       Review of systems complete and found to be negative unless listed above      PHYSICAL EXAM  General: Well developed, well nourished, in no acute distress HEENT:  Normocephalic and atramatic Neck:  No JVD.  Lungs: Clear bilaterally to auscultation and percussion. Heart: HRRR . Normal S1 and S2 without gallops or murmurs.  Abdomen: Bowel sounds are positive, abdomen soft and non-tender  Msk:  Back normal, normal gait. Normal strength and tone for age. Extremities: No clubbing, cyanosis or edema.   Neuro: Alert and oriented X 3. Psych:  Good affect, responds appropriately  Labs:   Lab Results  Component Value Date  WBC 13.4 (H) 09/07/2021   HGB 14.8 09/07/2021   HCT 45.4 09/07/2021   MCV 92.7 09/07/2021   PLT 168 09/07/2021    Recent Labs  Lab 09/07/21 0753  NA 135  K 4.6  CL 103  CO2 24  BUN 19  CREATININE 0.82  CALCIUM 8.9  GLUCOSE 235*   No results found for: "CKTOTAL", "CKMB", "CKMBINDEX", "TROPONINI"  Lab Results  Component Value Date   CHOL 174 07/18/2015   CHOL 217 (H) 01/14/2015   Lab Results  Component Value Date   HDL 51 07/18/2015   HDL 41 01/14/2015   Lab Results  Component Value Date   LDLCALC 107 (H) 07/18/2015   LDLCALC 148 (H) 01/14/2015   Lab Results  Component  Value Date   TRIG 80 07/18/2015   TRIG 140 01/14/2015   Lab Results  Component Value Date   CHOLHDL 3.4 07/18/2015   CHOLHDL 5.3 (H) 01/14/2015   No results found for: "LDLDIRECT"    Radiology: DG Chest Portable 1 View  Result Date: 09/07/2021 CLINICAL DATA:  76 year old male with chest pain radiating to the back. Shortness of breath. EXAM: PORTABLE CHEST 1 VIEW COMPARISON:  Chest radiographs 09/04/2021 and earlier. FINDINGS: Portable AP upright view at 0805 hours. Lower lung volumes with mild crowding of markings. Mediastinal contours remain within normal limits. Visualized tracheal air column is within normal limits. No pneumothorax, pleural effusion or confluent pulmonary opacity. No pulmonary edema suspected. Stable visualized osseous structures. Negative visible bowel gas. IMPRESSION: Lower lung volumes.  No acute cardiopulmonary abnormality. Electronically Signed   By: Odessa Fleming M.D.   On: 09/07/2021 08:19   DG Chest 2 View  Result Date: 09/04/2021 CLINICAL DATA:  Fall pneumonia EXAM: CHEST - 2 VIEW COMPARISON:  05/17/2021 FINDINGS: The heart size and mediastinal contours are within normal limits. Interval resolution of previously seen right perihilar airspace opacity. Both lungs are clear. The visualized skeletal structures are unremarkable. IMPRESSION: No active cardiopulmonary disease. Interval resolution of previously seen right perihilar airspace opacity. Electronically Signed   By: Duanne Guess D.O.   On: 09/04/2021 15:56    EKG: Sinus rhythm with concave, up scooping ST elevation in leads II, III and aVF, V4, 5 and 6 with associated PR depression  ASSESSMENT AND PLAN:   1.  Chest pain, pleuritic in nature, x1 week, with completely normal high-sensitivity troponin, likely not ACS, ECG revealing diffuse up scooping ST elevation with PR depression, consistent with pericarditis, and patient with history of pulmonary embolus, and elevated D-dimer 2.  Essential hypertension, on  benazepril, blood pressure low normal 3.  Hyperlipidemia, on atorvastatin  Recommendations  1.  Agree with current therapy 2.  Await VQ scan 3.  If VQ scan positive for pulmonary embolus, then start heparin bolus and infusion 4.  If VQ scan negative for pulmonary embolus, start colchicine for pericarditis 5.  2D echocardiogram  Signed: Marcina Millard MD,PhD, [facility] 09/07/2021, 10:45 AM

## 2021-09-07 NOTE — Assessment & Plan Note (Signed)
Recent A1c 6.0.  Well-controlled.  Blood sugar 235 today.  Patient's not taking medications currently -Sliding scale insulin

## 2021-09-07 NOTE — Assessment & Plan Note (Signed)
Patient is not taking medications currently -Follow-up FLP 

## 2021-09-08 ENCOUNTER — Encounter: Payer: Self-pay | Admitting: Internal Medicine

## 2021-09-08 ENCOUNTER — Observation Stay
Admit: 2021-09-08 | Discharge: 2021-09-08 | Disposition: A | Discharge status: Home / Self Care | Payer: Medicare PPO | Attending: Internal Medicine | Admitting: Internal Medicine

## 2021-09-08 ENCOUNTER — Encounter: Payer: Self-pay | Admitting: Cardiology

## 2021-09-08 DIAGNOSIS — I300 Acute nonspecific idiopathic pericarditis: Secondary | ICD-10-CM

## 2021-09-08 DIAGNOSIS — I10 Essential (primary) hypertension: Secondary | ICD-10-CM | POA: Diagnosis not present

## 2021-09-08 DIAGNOSIS — I309 Acute pericarditis, unspecified: Secondary | ICD-10-CM | POA: Diagnosis not present

## 2021-09-08 DIAGNOSIS — E785 Hyperlipidemia, unspecified: Secondary | ICD-10-CM | POA: Diagnosis not present

## 2021-09-08 DIAGNOSIS — R7303 Prediabetes: Secondary | ICD-10-CM | POA: Diagnosis not present

## 2021-09-08 DIAGNOSIS — I200 Unstable angina: Secondary | ICD-10-CM | POA: Diagnosis not present

## 2021-09-08 DIAGNOSIS — R079 Chest pain, unspecified: Secondary | ICD-10-CM | POA: Diagnosis not present

## 2021-09-08 LAB — ECHOCARDIOGRAM COMPLETE
AR max vel: 3.04 cm2
AV Area VTI: 3.41 cm2
AV Area mean vel: 2.93 cm2
AV Mean grad: 3.5 mmHg
AV Peak grad: 6.1 mmHg
Ao pk vel: 1.23 m/s
Area-P 1/2: 3.58 cm2
Height: 70 in
S' Lateral: 2.4 cm
Weight: 2560 oz

## 2021-09-08 LAB — CBC
HCT: 41.1 % (ref 39.0–52.0)
Hemoglobin: 13.5 g/dL (ref 13.0–17.0)
MCH: 29.9 pg (ref 26.0–34.0)
MCHC: 32.8 g/dL (ref 30.0–36.0)
MCV: 91.1 fL (ref 80.0–100.0)
Platelets: 171 10*3/uL (ref 150–400)
RBC: 4.51 MIL/uL (ref 4.22–5.81)
RDW: 13.4 % (ref 11.5–15.5)
WBC: 13.7 10*3/uL — ABNORMAL HIGH (ref 4.0–10.5)
nRBC: 0 % (ref 0.0–0.2)

## 2021-09-08 LAB — LIPID PANEL
Cholesterol: 140 mg/dL (ref 0–200)
HDL: 55 mg/dL (ref >40)
LDL Cholesterol: 79 mg/dL (ref 0–99)
Total CHOL/HDL Ratio: 2.5 RATIO
Triglycerides: 32 mg/dL (ref <150)
VLDL: 6 mg/dL (ref 0–40)

## 2021-09-08 LAB — GLUCOSE, CAPILLARY
Glucose-Capillary: 117 mg/dL — ABNORMAL HIGH (ref 70–99)
Glucose-Capillary: 161 mg/dL — ABNORMAL HIGH (ref 70–99)

## 2021-09-08 MED ORDER — COLCHICINE 0.6 MG PO TABS: 0.6000 mg | ORAL_TABLET | Freq: Two times a day (BID) | ORAL | 0 refills | Status: AC

## 2021-09-08 MED ORDER — COLCHICINE 0.6 MG PO TABS: 0.6000 mg | ORAL_TABLET | Freq: Two times a day (BID) | ORAL | 0 refills | Status: DC

## 2021-09-08 NOTE — TOC CM/SW Note (Signed)
Patient has orders to discharge home today. Chart reviewed. PCP is Maudie Flakes, MD. On room air. No wounds. No TOC needs identified. CSW signing off.  Charlynn Court, CSW [phone]

## 2021-09-08 NOTE — Care Management Obs Status (Signed)
MEDICARE OBSERVATION STATUS NOTIFICATION   Patient Details  Name: Kevin Acosta MRN: [id number] Date of Birth: Aug 31, 1945   Medicare Observation Status Notification Given:  Yes    Margarito Liner, LCSW 09/08/2021, 12:56 PM

## 2021-09-08 NOTE — Discharge Summary (Signed)
Physician Discharge Summary  Kevin Acosta YYT:[id number] DOB: Jun 09, 1945 DOA: 09/07/2021  PCP: Marina Goodell, MD  Admit date: 09/07/2021 Discharge date: 09/08/2021  Admitted From: home  Disposition:  home   Recommendations for Outpatient Follow-up:  Follow up with PCP in 1-2 weeks F/u w/ cardio, Dr. Darrold Junker, in 1-2 weeks   Home Health: no  Equipment/Devices:  Discharge Condition: stable  CODE STATUS: full  Diet recommendation: Heart Healthy  Brief/Interim Summary: HPI was taken from Dr. Clyde Lundborg: Kevin Acosta is a 76 y.o. 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   Patient states that he has chest pain for about 3 days, which is located in the central chest, sharp, 6 out of 10 in severity, radiation to back, pleuritic, aggravated by deep breath.  Patient has mild dry cough, no shortness of breath.  No fever or chills.  Patient was seen in walk-in clinic on Thursday and had chest x-ray and EKG which was fine for patient.  No nausea vomiting, diarrhea or abdominal pain.  No symptoms of UTI.     Data reviewed independently and ED Course: pt was found to have negative troponin x3 so far, WBC 13.3, GFR> 60, negative COVID PCR, temperature normal, blood pressure 109/67, heart rate 78, RR 20, oxygen saturation 100% on room air.  Chest x-ray negative.  D-dimer positive at 0.98.  Lower extremity venous Doppler negative for DVT.  VQ scan has low probability for PE.  Patient is placed on telemetry bed for observation.  Dr. Darrold Junker of cardiology is consulted.     EKG: I have personally reviewed.  Sinus rhythm, QTc 421, ST elevation in the inferior leads and mild ST elevation in precordial leads no reciprocal ST depression, PR segment elevation in aVR.  As per Dr. Mayford Knife 09/08/21: Pt presented w/ chest pain. VQ scan was done and showed low probability of PE so pt was  started on colchicine as per cardio recs. Pt should continue colchicine x 2-3 months as per cardio. Pt should f/u cardio, Dr. Darrold Junker, in 1-2 weeks. Echo was done which showed EF 65-70%, diastolic function was normal, no regional wall motion abnormalities and no atrial level shunt detected.   Discharge Diagnoses:  Principal Problem:   Chest pain Active Problems:   Hyperlipidemia   Essential hypertension   Prediabetes   Leukocytosis  Chest pain: VQ scan show low probability for PE. Started on colchicine for possible pericarditis as per cardio   HLD: not on a statin as per med rec  HTN: continue on home dose of benazepril. IV hydralazine prn    Pre-diabetes: HbA1c 6.0. Continue on SSI w/ accuchecks   Leukocytosis: likely reactive. Will continue to monitor   Discharge Instructions  Discharge Instructions     Diet - low sodium heart healthy   Complete by: As directed    Discharge instructions   Complete by: As directed    F/u w/ cardio, Dr. Darrold Junker, in 1-2 weeks. F/u w/ PCP in 1-2 weeks. A script for colchicine was sent to your pharmacy for a 30 day supply but you will need continue this medication for 2-3 months and you can get refills from your PCP or cardiologist.   Increase activity slowly   Complete by: As directed       Allergies as of 09/08/2021       Reactions   Contrast Media [iodinated Contrast Media] Other (See Comments)  Hypotension, Vagal, "coded"        Medication List     TAKE these medications    ascorbic acid 500 MG tablet Commonly known as: VITAMIN C Take 500 mg by mouth daily.   benazepril 5 MG tablet Commonly known as: LOTENSIN Take 5 mg by mouth daily.   Centrum Silver 50+Men Tabs Take 1 tablet by mouth daily.   colchicine 0.6 MG tablet Take 1 tablet (0.6 mg total) by mouth 2 (two) times daily.   Fish Oil Omega-3 1000 MG Caps Take 1 capsule by mouth daily.   Vitamin D3 25 MCG (1000 UT) Caps Take 1,000 Units by mouth daily.    Zinc 50 MG Tabs Take 50 mg by mouth daily.        Follow-up Information     Paraschos, Alexander, MD. Go in 1 week(s).   Specialty: Cardiology Contact information: [address] [facility] [place] [address] [phone]                Allergies  Allergen Reactions   Contrast Media [Iodinated Contrast Media] Other (See Comments)    Hypotension, Vagal, "coded"    Consultations: Cardio    Procedures/Studies: ECHOCARDIOGRAM COMPLETE  Result Date: 09/08/2021    ECHOCARDIOGRAM REPORT   Patient Name:   Kevin Acosta Date of Exam: 09/08/2021 Medical Rec #:  [id number]        Height:       70.0 in Accession #:    [id number]       Weight:       160.0 lb Date of Birth:  January 11, 1946        BSA:          1.898 m Patient Age:    76 years         BP:           122/80 mmHg Patient Gender: M                HR:           95 bpm. Exam Location:  [facility] Procedure: 2D Echo, Cardiac Doppler and Color Doppler Indications:     Chest pain R07.9  History:         Patient has no prior history of Echocardiogram examinations.                  Risk Factors:Dyslipidemia and Hypertension. PE.  Sonographer:     Cristela Blue Referring Phys:  [id number] Brien Few NIU Diagnosing Phys: Arnoldo Hooker MD  Sonographer Comments: Image quality was fair. IMPRESSIONS  1. Left ventricular ejection fraction, by estimation, is 65 to 70%. The left ventricle has normal function. The left ventricle has no regional wall motion abnormalities. Left ventricular diastolic parameters were normal.  2. Right ventricular systolic function is normal. The right ventricular size is normal.  3. The mitral valve is normal in structure. Trivial mitral valve regurgitation.  4. The aortic valve is normal in structure. Aortic valve regurgitation is not visualized. FINDINGS  Left Ventricle: Left ventricular ejection fraction, by estimation, is 65 to 70%. The left ventricle has normal function. The left ventricle has no regional  wall motion abnormalities. The left ventricular internal cavity size was normal in size. There is  no left ventricular hypertrophy. Left ventricular diastolic parameters were normal. Right Ventricle: The right ventricular size is normal. No increase in right ventricular wall thickness. Right ventricular systolic function is normal. Left Atrium: Left atrial size was normal in  size. Right Atrium: Right atrial size was normal in size. Pericardium: There is no evidence of pericardial effusion. Mitral Valve: The mitral valve is normal in structure. Trivial mitral valve regurgitation. Tricuspid Valve: The tricuspid valve is normal in structure. Tricuspid valve regurgitation is trivial. Aortic Valve: The aortic valve is normal in structure. Aortic valve regurgitation is not visualized. Aortic valve mean gradient measures 3.5 mmHg. Aortic valve peak gradient measures 6.1 mmHg. Aortic valve area, by VTI measures 3.41 cm. Pulmonic Valve: The pulmonic valve was normal in structure. Pulmonic valve regurgitation is not visualized. Aorta: The aortic root and ascending aorta are structurally normal, with no evidence of dilitation. IAS/Shunts: No atrial level shunt detected by color flow Doppler.  LEFT VENTRICLE PLAX 2D LVIDd:         4.00 cm   Diastology LVIDs:         2.40 cm   LV e' medial:    7.18 cm/s LV PW:         1.30 cm   LV E/e' medial:  10.6 LV IVS:        1.00 cm   LV e' lateral:   6.20 cm/s LVOT diam:     2.00 cm   LV E/e' lateral: 12.2 LV SV:         67 LV SV Index:   35 LVOT Area:     3.14 cm  RIGHT VENTRICLE RV Basal diam:  3.10 cm RV S prime:     16.40 cm/s TAPSE (M-mode): 2.3 cm LEFT ATRIUM             Index        RIGHT ATRIUM           Index LA diam:        2.60 cm 1.37 cm/m   RA Area:     12.70 cm LA Vol (A2C):   47.3 ml 24.91 ml/m  RA Volume:   30.60 ml  16.12 ml/m LA Vol (A4C):   46.5 ml 24.49 ml/m LA Biplane Vol: 47.8 ml 25.18 ml/m  AORTIC VALVE AV Area (Vmax):    3.04 cm AV Area (Vmean):   2.93 cm  AV Area (VTI):     3.41 cm AV Vmax:           123.00 cm/s AV Vmean:          87.550 cm/s AV VTI:            0.196 m AV Peak Grad:      6.1 mmHg AV Mean Grad:      3.5 mmHg LVOT Vmax:         119.00 cm/s LVOT Vmean:        81.700 cm/s LVOT VTI:          0.213 m LVOT/AV VTI ratio: 1.09  AORTA Ao Root diam: 3.35 cm MITRAL VALVE               TRICUSPID VALVE MV Area (PHT): 3.58 cm    TR Peak grad:   14.0 mmHg MV Decel Time: 212 msec    TR Vmax:        187.00 cm/s MV E velocity: 75.80 cm/s MV A velocity: 99.80 cm/s  SHUNTS MV E/A ratio:  0.76        Systemic VTI:  0.21 m  Systemic Diam: 2.00 cm Arnoldo Hooker MD Electronically signed by Arnoldo Hooker MD Signature Date/Time: 09/08/2021/12:00:53 PM    Final    US Venous Img Lower Bilateral (DVT)  Result Date: 09/07/2021 CLINICAL DATA:  Positive D-dimer EXAM: BILATERAL LOWER EXTREMITY VENOUS DOPPLER ULTRASOUND TECHNIQUE: Gray-scale sonography with compression, as well as color and duplex ultrasound, were performed to evaluate the deep venous system(s) from the level of the common femoral vein through the popliteal and proximal calf veins. COMPARISON:  None Available. FINDINGS: VENOUS Normal compressibility of the common femoral, superficial femoral, and popliteal veins, as well as the visualized calf veins. Visualized portions of profunda femoral vein and great saphenous vein unremarkable. No filling defects to suggest DVT on grayscale or color Doppler imaging. Doppler waveforms show normal direction of venous flow, normal respiratory plasticity and response to augmentation. Limited views of the contralateral common femoral vein are unremarkable. OTHER None. Limitations: none IMPRESSION: Negative. Electronically Signed   By: Gerome Sam III M.D.   On: 09/07/2021 12:59   NM Pulmonary Perfusion  Result Date: 09/07/2021 CLINICAL DATA:  Pulmonary embolism (PE) suspected, high prob EXAM: NUCLEAR MEDICINE PERFUSION LUNG SCAN TECHNIQUE:  Perfusion images were obtained in multiple projections after intravenous injection of radiopharmaceutical. Ventilation scans intentionally deferred if perfusion scan and chest x-ray adequate for interpretation during COVID 19 epidemic. RADIOPHARMACEUTICALS:  4.32 mCi Tc-38m MAA IV COMPARISON:  Same day chest x-ray FINDINGS: Physiologic distribution of radiotracer throughout both lungs. No segmental or larger perfusion defect. IMPRESSION: Low probability for pulmonary embolism. Electronically Signed   By: Duanne Guess D.O.   On: 09/07/2021 11:27   DG Chest Portable 1 View  Result Date: 09/07/2021 CLINICAL DATA:  76 year old male with chest pain radiating to the back. Shortness of breath. EXAM: PORTABLE CHEST 1 VIEW COMPARISON:  Chest radiographs 09/04/2021 and earlier. FINDINGS: Portable AP upright view at 0805 hours. Lower lung volumes with mild crowding of markings. Mediastinal contours remain within normal limits. Visualized tracheal air column is within normal limits. No pneumothorax, pleural effusion or confluent pulmonary opacity. No pulmonary edema suspected. Stable visualized osseous structures. Negative visible bowel gas. IMPRESSION: Lower lung volumes.  No acute cardiopulmonary abnormality. Electronically Signed   By: Odessa Fleming M.D.   On: 09/07/2021 08:19   DG Chest 2 View  Result Date: 09/04/2021 CLINICAL DATA:  Fall pneumonia EXAM: CHEST - 2 VIEW COMPARISON:  05/17/2021 FINDINGS: The heart size and mediastinal contours are within normal limits. Interval resolution of previously seen right perihilar airspace opacity. Both lungs are clear. The visualized skeletal structures are unremarkable. IMPRESSION: No active cardiopulmonary disease. Interval resolution of previously seen right perihilar airspace opacity. Electronically Signed   By: Duanne Guess D.O.   On: 09/04/2021 15:56   (Echo, Carotid, EGD, Colonoscopy, ERCP)    Subjective: Pt denies shortness of breath or chest pain     Discharge Exam: Vitals:   09/08/21 0751 09/08/21 1133  BP: (!) 143/81 138/83  Pulse: 95 94  Resp: 14 15  Temp: 98.7 F (37.1 C) 99.4 F (37.4 C)  SpO2: 99% 98%   Vitals:   09/07/21 2346 09/08/21 0318 09/08/21 0751 09/08/21 1133  BP: 129/82 122/80 (!) 143/81 138/83  Pulse: 95 95 95 94  Resp: 16 16 14 15   Temp: 98.8 F (37.1 C) 99.2 F (37.3 C) 98.7 F (37.1 C) 99.4 F (37.4 C)  TempSrc:      SpO2: 99% 99% 99% 98%  Weight:      Height:  General: Pt is alert, awake, not in acute distress Cardiovascular: S1/S2 +, no rubs, no gallops Respiratory: CTA bilaterally, no wheezing, no rhonchi Abdominal: Soft, NT, ND, bowel sounds + Extremities: no edema, no cyanosis    The results of significant diagnostics from this hospitalization (including imaging, microbiology, ancillary and laboratory) are listed below for reference.     Microbiology: Recent Results (from the past 240 hour(s))  SARS Coronavirus 2 by RT PCR (hospital order, performed in [facility] hospital lab) *cepheid single result test* Anterior Nasal Swab     Status: None   Collection Time: 09/07/21  8:25 AM   Specimen: Anterior Nasal Swab  Result Value Ref Range Status   SARS Coronavirus 2 by RT PCR NEGATIVE NEGATIVE Final    Comment: (NOTE) SARS-CoV-2 target nucleic acids are NOT DETECTED.  The SARS-CoV-2 RNA is generally detectable in upper and lower respiratory specimens during the acute phase of infection. The lowest concentration of SARS-CoV-2 viral copies this assay can detect is 250 copies / mL. A negative result does not preclude SARS-CoV-2 infection and should not be used as the sole basis for treatment or other patient management decisions.  A negative result may occur with improper specimen collection / handling, submission of specimen other than nasopharyngeal swab, presence of viral mutation(s) within the areas targeted by this assay, and inadequate number of viral copies (<250 copies  / mL). A negative result must be combined with clinical observations, patient history, and epidemiological information.  Fact Sheet for Patients:   RoadLapTop.co.za  Fact Sheet for Healthcare Providers: http://kim-miller.com/  This test is not yet approved or  cleared by the [place] FDA and has been authorized for detection and/or diagnosis of SARS-CoV-2 by FDA under an Emergency Use Authorization (EUA).  This EUA will remain in effect (meaning this test can be used) for the duration of the COVID-19 declaration under Section 564(b)(1) of the Act, 21 U.S.C. section 360bbb-3(b)(1), unless the authorization is terminated or revoked sooner.  Performed at [facility], [address] [place], [place] [address]      Labs: BNP (last 3 results) No results for input(s): "BNP" in the last 8760 hours. Basic Metabolic Panel: Recent Labs  Lab 09/07/21 0753  NA 135  K 4.6  CL 103  CO2 24  GLUCOSE 235*  BUN 19  CREATININE 0.82  CALCIUM 8.9   Liver Function Tests: No results for input(s): "AST", "ALT", "ALKPHOS", "BILITOT", "PROT", "ALBUMIN" in the last 168 hours. No results for input(s): "LIPASE", "AMYLASE" in the last 168 hours. No results for input(s): "AMMONIA" in the last 168 hours. CBC: Recent Labs  Lab 09/07/21 0753 09/08/21 0421  WBC 13.4* 13.7*  HGB 14.8 13.5  HCT 45.4 41.1  MCV 92.7 91.1  PLT 168 171   Cardiac Enzymes: No results for input(s): "CKTOTAL", "CKMB", "CKMBINDEX", "TROPONINI" in the last 168 hours. BNP: Invalid input(s): "POCBNP" CBG: Recent Labs  Lab 09/07/21 1221 09/07/21 1637 09/07/21 2139 09/08/21 0753 09/08/21 1133  GLUCAP 146* 124* 147* 117* 161*   D-Dimer Recent Labs    09/07/21 0924  DDIMER 0.98*   Hgb A1c Recent Labs    09/07/21 0753  HGBA1C 5.8*   Lipid Profile Recent Labs    09/08/21 0421  CHOL 140  HDL 55  LDLCALC 79  TRIG 32  CHOLHDL 2.5   Thyroid  function studies No results for input(s): "TSH", "T4TOTAL", "T3FREE", "THYROIDAB" in the last 72 hours.  Invalid input(s): "FREET3" Anemia work up No results  for input(s): "VITAMINB12", "FOLATE", "FERRITIN", "TIBC", "IRON", "RETICCTPCT" in the last 72 hours. Urinalysis No results found for: "COLORURINE", "APPEARANCEUR", "LABSPEC", "PHURINE", "GLUCOSEU", "HGBUR", "BILIRUBINUR", "KETONESUR", "PROTEINUR", "UROBILINOGEN", "NITRITE", "LEUKOCYTESUR" Sepsis Labs Recent Labs  Lab 09/07/21 0753 09/08/21 0421  WBC 13.4* 13.7*   Microbiology Recent Results (from the past 240 hour(s))  SARS Coronavirus 2 by RT PCR (hospital order, performed in [facility] hospital lab) *cepheid single result test* Anterior Nasal Swab     Status: None   Collection Time: 09/07/21  8:25 AM   Specimen: Anterior Nasal Swab  Result Value Ref Range Status   SARS Coronavirus 2 by RT PCR NEGATIVE NEGATIVE Final    Comment: (NOTE) SARS-CoV-2 target nucleic acids are NOT DETECTED.  The SARS-CoV-2 RNA is generally detectable in upper and lower respiratory specimens during the acute phase of infection. The lowest concentration of SARS-CoV-2 viral copies this assay can detect is 250 copies / mL. A negative result does not preclude SARS-CoV-2 infection and should not be used as the sole basis for treatment or other patient management decisions.  A negative result may occur with improper specimen collection / handling, submission of specimen other than nasopharyngeal swab, presence of viral mutation(s) within the areas targeted by this assay, and inadequate number of viral copies (<250 copies / mL). A negative result must be combined with clinical observations, patient history, and epidemiological information.  Fact Sheet for Patients:   RoadLapTop.co.za  Fact Sheet for Healthcare Providers: http://kim-miller.com/  This test is not yet approved or  cleared by the [place] FDA and has been authorized for detection and/or diagnosis of SARS-CoV-2 by FDA under an Emergency Use Authorization (EUA).  This EUA will remain in effect (meaning this test can be used) for the duration of the COVID-19 declaration under Section 564(b)(1) of the Act, 21 U.S.C. section 360bbb-3(b)(1), unless the authorization is terminated or revoked sooner.  Performed at [facility], [address] [place], [place] [address]      Time coordinating discharge: Over 30 minutes  SIGNED:   Charise Killian, MD  [facility] 09/08/2021, 12:37 PM Pager   If 7PM-7AM, please contact night-coverage [email or web address]

## 2021-09-08 NOTE — Progress Notes (Signed)
[facility] Cardiology  CARDIOLOGY CONSULT NOTE  Patient ID: Kevin Acosta MRN: [id number] DOB/AGE: [birth year] 76 y.o.  Admit date: 09/07/2021 Referring Physician Roxan Hockey Primary Physician [facility] Primary Cardiologist  Reason for Consultation chest pain  HPI: 76 year old gentleman referred for evaluation of chest pain.  Patient has a history of ventral hypertension and hyperlipidemia.  History of pulmonary embolus in 2004.  Presents to [facility] ED with 1 week history of current chest pain.  Patient was seen on 03/07/2021 at urgent care EKG and chest x-ray were unremarkable.  The patient reports recurrent, focal, left-sided chest pain, nonexertional, precipitated by deep breaths.  I had the patient sit up and leaning forward with resolution of chest pain.  ECG revealed sinus rhythm with up scooping ST depressions in leads II, III, aVF, V3, 4 and 6, with PR depression in the same leads.  Admission labs were notable for troponin (3, less than 2).  White count was elevated at 13,400.  D-dimer was elevated 0.98.  Chest x-ray did not reveal any acute cardiopulmonary abnormality.  Chest CT was deferred due to history of severe contrast dye allergic reaction.  VQ scan resulted low risk for PE.  Interval History: -feels much better, occasional pleuritic chest pain but overall much improved than on admission. Ambulated though the halls without difficulty. -dry cough present, denies palpitations, SOB -echo performed this morning, pending read  Review of systems complete and found to be negative unless listed above     Past Medical History:  Diagnosis Date   Hyperlipidemia    Hypertension    PE (pulmonary thromboembolism) (HCC)     Past Surgical History:  Procedure Laterality Date   NO PAST SURGERIES     VISCERAL ANGIOGRAPHY N/A 07/02/2018   Procedure: VISCERAL ANGIOGRAPHY with possible embolization;  Surgeon: Annice Needy, MD;  Location: [facility] INVASIVE CV LAB;  Service: Cardiovascular;  Laterality: N/A;     Medications Prior to Admission  Medication Sig Dispense Refill Last Dose   ascorbic acid (VITAMIN C) 500 MG tablet Take 500 mg by mouth daily.      benazepril (LOTENSIN) 5 MG tablet Take 5 mg by mouth daily.   09/07/2021 at 0800   Cholecalciferol (VITAMIN D3) 25 MCG (1000 UT) CAPS Take 1,000 Units by mouth daily.      Multiple Vitamins-Minerals (CENTRUM SILVER 50+MEN) TABS Take 1 tablet by mouth daily.      Omega-3 Fatty Acids (FISH OIL OMEGA-3) 1000 MG CAPS Take 1 capsule by mouth daily.      Zinc 50 MG TABS Take 50 mg by mouth daily.       Social History   Socioeconomic History   Marital status: Married    Spouse name: Not on file   Number of children: Not on file   Years of education: Not on file   Highest education level: Not on file  Occupational History   Not on file  Tobacco Use   Smoking status: Never   Smokeless tobacco: Never  Vaping Use   Vaping Use: Never used  Substance and Sexual Activity   Alcohol use: Yes    Comment: social   Drug use: No   Sexual activity: Not on file  Other Topics Concern   Not on file  Social History Narrative   Not on file   Social Determinants of Health   Financial Resource Strain: Not on file  Food Insecurity: Not on file  Transportation Needs: Not on file  Physical Activity: Not on file  Stress:  Not on file  Social Connections: Not on file  Intimate Partner Violence: Not on file    Family History  Problem Relation Age of Onset   Diabetes Mother    Lung cancer Father      PHYSICAL EXAM  General: Pleasant elderly caucasian male, well nourished, in no acute distress. Sitting upright in PCU bed with daughter and wife at bedside HEENT:  Normocephalic and atramatic Neck:  No JVD.  Lungs: Clear bilaterally to auscultation and percussion. Heart: HRRR . Normal S1 and S2 without appreciable rub, gallops or murmurs.  Abdomen: nondistended appearing Msk:  Normal strength and tone for age. Extremities: No clubbing, cyanosis or  edema.   Neuro: Alert and oriented X 3. Psych:  Good affect, responds appropriately  Labs:   Lab Results  Component Value Date   WBC 13.7 (H) 09/08/2021   HGB 13.5 09/08/2021   HCT 41.1 09/08/2021   MCV 91.1 09/08/2021   PLT 171 09/08/2021    Recent Labs  Lab 09/07/21 0753  NA 135  K 4.6  CL 103  CO2 24  BUN 19  CREATININE 0.82  CALCIUM 8.9  GLUCOSE 235*    No results found for: "CKTOTAL", "CKMB", "CKMBINDEX", "TROPONINI"  Lab Results  Component Value Date   CHOL 140 09/08/2021   CHOL 174 07/18/2015   CHOL 217 (H) 01/14/2015   Lab Results  Component Value Date   HDL 55 09/08/2021   HDL 51 07/18/2015   HDL 41 01/14/2015   Lab Results  Component Value Date   LDLCALC 79 09/08/2021   LDLCALC 107 (H) 07/18/2015   LDLCALC 148 (H) 01/14/2015   Lab Results  Component Value Date   TRIG 32 09/08/2021   TRIG 80 07/18/2015   TRIG 140 01/14/2015   Lab Results  Component Value Date   CHOLHDL 2.5 09/08/2021   CHOLHDL 3.4 07/18/2015   CHOLHDL 5.3 (H) 01/14/2015   No results found for: "LDLDIRECT"    Radiology: US Venous Img Lower Bilateral (DVT)  Result Date: 09/07/2021 CLINICAL DATA:  Positive D-dimer EXAM: BILATERAL LOWER EXTREMITY VENOUS DOPPLER ULTRASOUND TECHNIQUE: Gray-scale sonography with compression, as well as color and duplex ultrasound, were performed to evaluate the deep venous system(s) from the level of the common femoral vein through the popliteal and proximal calf veins. COMPARISON:  None Available. FINDINGS: VENOUS Normal compressibility of the common femoral, superficial femoral, and popliteal veins, as well as the visualized calf veins. Visualized portions of profunda femoral vein and great saphenous vein unremarkable. No filling defects to suggest DVT on grayscale or color Doppler imaging. Doppler waveforms show normal direction of venous flow, normal respiratory plasticity and response to augmentation. Limited views of the contralateral common  femoral vein are unremarkable. OTHER None. Limitations: none IMPRESSION: Negative. Electronically Signed   By: Gerome Sam III M.D.   On: 09/07/2021 12:59   NM Pulmonary Perfusion  Result Date: 09/07/2021 CLINICAL DATA:  Pulmonary embolism (PE) suspected, high prob EXAM: NUCLEAR MEDICINE PERFUSION LUNG SCAN TECHNIQUE: Perfusion images were obtained in multiple projections after intravenous injection of radiopharmaceutical. Ventilation scans intentionally deferred if perfusion scan and chest x-ray adequate for interpretation during COVID 19 epidemic. RADIOPHARMACEUTICALS:  4.32 mCi Tc-73m MAA IV COMPARISON:  Same day chest x-ray FINDINGS: Physiologic distribution of radiotracer throughout both lungs. No segmental or larger perfusion defect. IMPRESSION: Low probability for pulmonary embolism. Electronically Signed   By: Duanne Guess D.O.   On: 09/07/2021 11:27   DG Chest Portable 1 View  Result  Date: 09/07/2021 CLINICAL DATA:  76 year old male with chest pain radiating to the back. Shortness of breath. EXAM: PORTABLE CHEST 1 VIEW COMPARISON:  Chest radiographs 09/04/2021 and earlier. FINDINGS: Portable AP upright view at 0805 hours. Lower lung volumes with mild crowding of markings. Mediastinal contours remain within normal limits. Visualized tracheal air column is within normal limits. No pneumothorax, pleural effusion or confluent pulmonary opacity. No pulmonary edema suspected. Stable visualized osseous structures. Negative visible bowel gas. IMPRESSION: Lower lung volumes.  No acute cardiopulmonary abnormality. Electronically Signed   By: Odessa Fleming M.D.   On: 09/07/2021 08:19   DG Chest 2 View  Result Date: 09/04/2021 CLINICAL DATA:  Fall pneumonia EXAM: CHEST - 2 VIEW COMPARISON:  05/17/2021 FINDINGS: The heart size and mediastinal contours are within normal limits. Interval resolution of previously seen right perihilar airspace opacity. Both lungs are clear. The visualized skeletal structures  are unremarkable. IMPRESSION: No active cardiopulmonary disease. Interval resolution of previously seen right perihilar airspace opacity. Electronically Signed   By: Duanne Guess D.O.   On: 09/04/2021 15:56    EKG: Sinus rhythm with concave, up scooping ST elevation in leads II, III and aVF, V4, 5 and 6 with associated PR depression  ASSESSMENT AND PLAN:   1.  Chest pain, pleuritic in nature, x1 week, with completely normal high-sensitivity troponin, likely not ACS, ECG revealing diffuse up scooping ST elevation with PR depression, consistent with pericarditis, and patient with history of pulmonary embolus, and elevated D-dimer. VQ scan resulted low risk for PE 2.  Essential hypertension, on benazepril, blood pressure low normal 3.  Hyperlipidemia, on atorvastatin  Recommendations  1.  Agree with current therapy 2.  VQ scan resulted low risk for pulmonary embolus,  3.  Started colchicine 0.6mg  BID for pericarditis yesterday with symptom improvement. Will continue this for 2-3 months.  4.  2D echocardiogram performed this morning, pending read.  5.  OK for discharge from a cardiac standpoint today after echo is read, needs close follow up with Dr. Darrold Junker in 1-2 weeks.   This patient's plan of care was discussed and created with Dr. Gwen Pounds and he is in agreement.    Signed: Cheryln Manly Hiral Lukasiewicz PA-C 09/08/2021, 11:54 AM

## 2021-09-12 DIAGNOSIS — R051 Acute cough: Secondary | ICD-10-CM | POA: Diagnosis not present

## 2021-09-12 DIAGNOSIS — Z09 Encounter for follow-up examination after completed treatment for conditions other than malignant neoplasm: Secondary | ICD-10-CM | POA: Diagnosis not present

## 2021-09-12 DIAGNOSIS — I300 Acute nonspecific idiopathic pericarditis: Secondary | ICD-10-CM | POA: Diagnosis not present

## 2021-09-18 ENCOUNTER — Other Ambulatory Visit: Payer: Self-pay

## 2021-09-18 ENCOUNTER — Emergency Department: Payer: Medicare Other

## 2021-09-18 ENCOUNTER — Observation Stay (HOSPITAL_BASED_OUTPATIENT_CLINIC_OR_DEPARTMENT_OTHER)
Admission: EM | Admit: 2021-09-18 | Discharge: 2021-09-19 | Disposition: A | Discharge status: Home / Self Care | Payer: Medicare Other | Source: Home / Self Care | Attending: Student in an Organized Health Care Education/Training Program | Admitting: Student in an Organized Health Care Education/Training Program

## 2021-09-18 DIAGNOSIS — Z79899 Other long term (current) drug therapy: Secondary | ICD-10-CM | POA: Insufficient documentation

## 2021-09-18 DIAGNOSIS — I4891 Unspecified atrial fibrillation: Secondary | ICD-10-CM | POA: Diagnosis present

## 2021-09-18 DIAGNOSIS — R000 Tachycardia, unspecified: Secondary | ICD-10-CM | POA: Diagnosis not present

## 2021-09-18 DIAGNOSIS — E7800 Pure hypercholesterolemia, unspecified: Secondary | ICD-10-CM | POA: Diagnosis not present

## 2021-09-18 DIAGNOSIS — E785 Hyperlipidemia, unspecified: Secondary | ICD-10-CM | POA: Diagnosis not present

## 2021-09-18 DIAGNOSIS — Z91041 Radiographic dye allergy status: Secondary | ICD-10-CM | POA: Diagnosis not present

## 2021-09-18 DIAGNOSIS — K219 Gastro-esophageal reflux disease without esophagitis: Secondary | ICD-10-CM | POA: Diagnosis not present

## 2021-09-18 DIAGNOSIS — Z66 Do not resuscitate: Secondary | ICD-10-CM | POA: Diagnosis not present

## 2021-09-18 DIAGNOSIS — J90 Pleural effusion, not elsewhere classified: Secondary | ICD-10-CM | POA: Diagnosis not present

## 2021-09-18 DIAGNOSIS — I4892 Unspecified atrial flutter: Secondary | ICD-10-CM | POA: Diagnosis not present

## 2021-09-18 DIAGNOSIS — K922 Gastrointestinal hemorrhage, unspecified: Secondary | ICD-10-CM | POA: Diagnosis present

## 2021-09-18 DIAGNOSIS — D72829 Elevated white blood cell count, unspecified: Secondary | ICD-10-CM | POA: Diagnosis not present

## 2021-09-18 DIAGNOSIS — Z86711 Personal history of pulmonary embolism: Secondary | ICD-10-CM | POA: Insufficient documentation

## 2021-09-18 DIAGNOSIS — Z833 Family history of diabetes mellitus: Secondary | ICD-10-CM | POA: Diagnosis not present

## 2021-09-18 DIAGNOSIS — Z7901 Long term (current) use of anticoagulants: Secondary | ICD-10-CM | POA: Diagnosis not present

## 2021-09-18 DIAGNOSIS — I10 Essential (primary) hypertension: Secondary | ICD-10-CM | POA: Insufficient documentation

## 2021-09-18 DIAGNOSIS — Z20822 Contact with and (suspected) exposure to covid-19: Secondary | ICD-10-CM | POA: Insufficient documentation

## 2021-09-18 DIAGNOSIS — Z801 Family history of malignant neoplasm of trachea, bronchus and lung: Secondary | ICD-10-CM | POA: Diagnosis not present

## 2021-09-18 DIAGNOSIS — M109 Gout, unspecified: Secondary | ICD-10-CM | POA: Diagnosis not present

## 2021-09-18 LAB — CBC WITH DIFFERENTIAL/PLATELET
Abs Immature Granulocytes: 0.05 10*3/uL (ref 0.00–0.07)
Basophils Absolute: 0.1 10*3/uL (ref 0.0–0.1)
Basophils Relative: 1 %
Eosinophils Absolute: 0.2 10*3/uL (ref 0.0–0.5)
Eosinophils Relative: 1 %
HCT: 42.9 % (ref 39.0–52.0)
Hemoglobin: 13.9 g/dL (ref 13.0–17.0)
Immature Granulocytes: 0 %
Lymphocytes Relative: 18 %
Lymphs Abs: 2.2 10*3/uL (ref 0.7–4.0)
MCH: 30.2 pg (ref 26.0–34.0)
MCHC: 32.4 g/dL (ref 30.0–36.0)
MCV: 93.3 fL (ref 80.0–100.0)
Monocytes Absolute: 1.1 10*3/uL — ABNORMAL HIGH (ref 0.1–1.0)
Monocytes Relative: 9 %
Neutro Abs: 8.9 10*3/uL — ABNORMAL HIGH (ref 1.7–7.7)
Neutrophils Relative %: 71 %
Platelets: 377 10*3/uL (ref 150–400)
RBC: 4.6 MIL/uL (ref 4.22–5.81)
RDW: 12.9 % (ref 11.5–15.5)
WBC: 12.4 10*3/uL — ABNORMAL HIGH (ref 4.0–10.5)
nRBC: 0 % (ref 0.0–0.2)

## 2021-09-18 LAB — COMPREHENSIVE METABOLIC PANEL
ALT: 47 U/L — ABNORMAL HIGH (ref 0–44)
AST: 32 U/L (ref 15–41)
Albumin: 3.2 g/dL — ABNORMAL LOW (ref 3.5–5.0)
Alkaline Phosphatase: 68 U/L (ref 38–126)
Anion gap: 13 (ref 5–15)
BUN: 20 mg/dL (ref 8–23)
CO2: 24 mmol/L (ref 22–32)
Calcium: 8.7 mg/dL — ABNORMAL LOW (ref 8.9–10.3)
Chloride: 101 mmol/L (ref 98–111)
Creatinine, Ser: 1.11 mg/dL (ref 0.61–1.24)
GFR, Estimated: >60 mL/min (ref >60)
Glucose, Bld: 157 mg/dL — ABNORMAL HIGH (ref 70–99)
Potassium: 3.4 mmol/L — ABNORMAL LOW (ref 3.5–5.1)
Sodium: 138 mmol/L (ref 135–145)
Total Bilirubin: 1.1 mg/dL (ref 0.3–1.2)
Total Protein: 7.3 g/dL (ref 6.5–8.1)

## 2021-09-18 LAB — SARS CORONAVIRUS 2 BY RT PCR: SARS Coronavirus 2 by RT PCR: NEGATIVE

## 2021-09-18 LAB — MAGNESIUM: Magnesium: 2.2 mg/dL (ref 1.7–2.4)

## 2021-09-18 MED ORDER — PANTOPRAZOLE SODIUM 20 MG PO TBEC
20.0000 mg | DELAYED_RELEASE_TABLET | Freq: Every day | ORAL | Status: DC
  Filled 2021-09-18: qty 1

## 2021-09-18 MED ORDER — ENOXAPARIN SODIUM 40 MG/0.4ML IJ SOSY
40.0000 mg | PREFILLED_SYRINGE | INTRAMUSCULAR | Status: DC
  Administered 2021-09-18: 40 mg via SUBCUTANEOUS
  Filled 2021-09-18: qty 0.4

## 2021-09-18 MED ORDER — SODIUM CHLORIDE 0.9 % IV BOLUS
1000.0000 mL | Freq: Once | INTRAVENOUS | Status: AC
  Administered 2021-09-18: 1000 mL via INTRAVENOUS

## 2021-09-18 MED ORDER — DILTIAZEM HCL 25 MG/5ML IV SOLN
15.0000 mg | Freq: Once | INTRAVENOUS | Status: AC
  Administered 2021-09-18: 15 mg via INTRAVENOUS
  Filled 2021-09-18: qty 5

## 2021-09-18 MED ORDER — ALPRAZOLAM 0.25 MG PO TABS: 0.2500 mg | ORAL_TABLET | Freq: Two times a day (BID) | ORAL | Status: DC | PRN

## 2021-09-18 MED ORDER — PANTOPRAZOLE SODIUM 40 MG IV SOLR
40.0000 mg | Freq: Once | INTRAVENOUS | Status: AC
  Administered 2021-09-18: 40 mg via INTRAVENOUS
  Filled 2021-09-18: qty 10

## 2021-09-18 MED ORDER — COLCHICINE 0.6 MG PO TABS
0.6000 mg | ORAL_TABLET | Freq: Two times a day (BID) | ORAL | Status: DC
  Administered 2021-09-18 – 2021-09-19 (×2): 0.6 mg via ORAL
  Filled 2021-09-18 (×3): qty 1

## 2021-09-18 MED ORDER — ACETAMINOPHEN 325 MG PO TABS: 650.0000 mg | ORAL_TABLET | ORAL | Status: DC | PRN

## 2021-09-18 MED ORDER — METOPROLOL TARTRATE 25 MG PO TABS
25.0000 mg | ORAL_TABLET | Freq: Two times a day (BID) | ORAL | Status: DC
  Administered 2021-09-18 – 2021-09-19 (×2): 25 mg via ORAL
  Filled 2021-09-18 (×2): qty 1

## 2021-09-18 MED ORDER — DILTIAZEM HCL ER COATED BEADS 120 MG PO CP24
240.0000 mg | ORAL_CAPSULE | Freq: Every day | ORAL | Status: DC
  Administered 2021-09-18 – 2021-09-19 (×2): 240 mg via ORAL
  Filled 2021-09-18 (×2): qty 2
  Filled 2021-09-18: qty 1

## 2021-09-18 MED ORDER — METOPROLOL TARTRATE 5 MG/5ML IV SOLN
5.0000 mg | Freq: Once | INTRAVENOUS | Status: AC
  Administered 2021-09-18: 5 mg via INTRAVENOUS
  Filled 2021-09-18: qty 5

## 2021-09-18 MED ORDER — ONDANSETRON HCL 4 MG/2ML IJ SOLN: 4.0000 mg | Freq: Four times a day (QID) | INTRAMUSCULAR | Status: DC | PRN

## 2021-09-18 MED ORDER — METOPROLOL TARTRATE 5 MG/5ML IV SOLN: 5.0000 mg | INTRAVENOUS | Status: DC | PRN

## 2021-09-18 MED ORDER — SODIUM CHLORIDE 0.9 % IV SOLN: Freq: Once | INTRAVENOUS | Status: AC

## 2021-09-18 MED ORDER — DILTIAZEM HCL-DEXTROSE 125-5 MG/125ML-% IV SOLN (PREMIX)
5.0000 mg/h | INTRAVENOUS | Status: DC
  Administered 2021-09-18 – 2021-09-19 (×2): 5 mg/h via INTRAVENOUS
  Filled 2021-09-18 (×3): qty 125

## 2021-09-18 NOTE — ED Triage Notes (Signed)
Pt here with tachycardia from Dr. Cassie Freer office. Pt states his HR was 166. Pt denies any cardiac hx.

## 2021-09-18 NOTE — ED Notes (Signed)
Fluid bolus finished- 161ml/hr restarted of NS

## 2021-09-18 NOTE — H&P (Addendum)
History and Physical    Kevin Acosta GEX:[id number] DOB: 15-Apr-1945 DOA: 09/18/2021  PCP: Marina Goodell, MD  Patient coming from: Home  I have personally briefly reviewed patient's old medical records in [facility]  Chief Complaint: Tachycardia, fatigue  HPI: Kevin Acosta is a 76 y.o. male with medical history significant of PE in 2004 no longer on anticoagulation, history of GI bleed in 2020, hypertension, hyperlipidemia who presents to the cardiologist office for chief complaint of tachycardia and fatigue.  Patient states that he has been abnormally fatigued over the past few days.  Presented to [facility] clinic cardiology office.  Was found to be markedly tachycardic and was sent to the emergency room.  In the emergency room twelve-lead EKG confirms rapid atrial flutter.  Started on diltiazem infusion.  This was at max rate with heart rate still in the 170s.  Cardiology contacted by EDP.  Recommended addition of beta-blocker to medication regimen.  This has been added but has yet to been effective at this time. On my evaluation patient is overall asymptomatic.  He is resting comfortably in bed.  He is in no visible distress.  Vital signs apart from the tachycardia are essentially normal.   ED Course: Patient was started on diltiazem gtt.  Escalated to max rate of 15 mg/h.  Remains tachycardic.  Cardiology contacted.  Metoprolol added.  Had not been administered at time of this note.  Hospitalist contacted for admission.  Review of Systems: As per HPI otherwise 14 point review of systems negative.    Past Medical History:  Diagnosis Date   Hyperlipidemia    Hypertension    PE (pulmonary thromboembolism) (HCC)     Past Surgical History:  Procedure Laterality Date   NO PAST SURGERIES     VISCERAL ANGIOGRAPHY N/A 07/02/2018   Procedure: VISCERAL ANGIOGRAPHY with possible embolization;  Surgeon: Annice Needy, MD;  Location: [facility] INVASIVE CV LAB;  Service:  Cardiovascular;  Laterality: N/A;     reports that he has never smoked. He has never used smokeless tobacco. He reports current alcohol use. He reports that he does not use drugs.  Allergies  Allergen Reactions   Contrast Media [Iodinated Contrast Media] Other (See Comments)    Hypotension, Vagal, "coded"    Family History  Problem Relation Age of Onset   Diabetes Mother    Lung cancer Father     Prior to Admission medications   Medication Sig Start Date End Date Taking? Authorizing Provider  ascorbic acid (VITAMIN C) 500 MG tablet Take 500 mg by mouth daily.   Yes [provider]  Cholecalciferol (VITAMIN D3) 25 MCG (1000 UT) CAPS Take 1,000 Units by mouth daily.   Yes [provider]  colchicine 0.6 MG tablet Take 1 tablet (0.6 mg total) by mouth 2 (two) times daily. 09/08/21 10/08/21 Yes Charise Killian, MD  losartan (COZAAR) 25 MG tablet Take 25 mg by mouth daily. 09/15/21  Yes [provider]  Multiple Vitamins-Minerals (CENTRUM SILVER 50+MEN) TABS Take 1 tablet by mouth daily.   Yes [provider]  Omega-3 Fatty Acids (FISH OIL OMEGA-3) 1000 MG CAPS Take 1 capsule by mouth daily.   Yes [provider]  Zinc 50 MG TABS Take 50 mg by mouth daily.   Yes [provider]  benazepril (LOTENSIN) 5 MG tablet Take 5 mg by mouth daily. Patient not taking: Reported on 09/18/2021    [provider]  benzonatate (TESSALON) 200 MG capsule  Take 1 capsule by mouth 3 (three) times daily as needed. Patient not taking: Reported on 09/18/2021 09/12/21   [provider]    Physical Exam: Vitals:   09/18/21 1730 09/18/21 1745 09/18/21 1800 09/18/21 1815  BP: 108/78 112/83 118/82 92/70  Pulse: (!) 168 (!) 170 (!) 170 86  Resp: 17 13 16 16   Temp:      TempSrc:      SpO2: 94% 95% 94% 94%  Weight:      Height:         Vitals:   09/18/21 1730 09/18/21 1745 09/18/21 1800 09/18/21 1815  BP: 108/78 112/83 118/82 92/70   Pulse: (!) 168 (!) 170 (!) 170 86  Resp: 17 13 16 16   Temp:      TempSrc:      SpO2: 94% 95% 94% 94%  Weight:      Height:       General: NAD HEENT: Normocephalic, atraumatic Neck, supple, trachea midline, no tenderness Heart: Tachycardic, regular rhythm, no murmurs, no pedal edema Lungs: Lungs clear.  Normal work of breathing.  Room air Abdomen: Soft, nontender, nondistended, positive bowel sounds Extremities: Normal, atraumatic, no clubbing or cyanosis, normal muscle tone Skin: No rashes or lesions, normal color Neurologic: Cranial nerves grossly intact, sensation intact, alert and oriented x3 Psychiatric: Normal affect    Labs on Admission: I have personally reviewed following labs and imaging studies  CBC: Recent Labs  Lab 09/18/21 1517  WBC 12.4*  NEUTROABS 8.9*  HGB 13.9  HCT 42.9  MCV 93.3  PLT [id number]   Basic Metabolic Panel: Recent Labs  Lab 09/18/21 1517 09/18/21 1606  NA 138  --   K 3.4*  --   CL 101  --   CO2 24  --   GLUCOSE 157*  --   BUN 20  --   CREATININE 1.11  --   CALCIUM 8.7*  --   MG  --  2.2   GFR: Estimated Creatinine Clearance: 58.1 mL/min (by C-G formula based on SCr of 1.11 mg/dL). Liver Function Tests: Recent Labs  Lab 09/18/21 1517  AST 32  ALT 47*  ALKPHOS 68  BILITOT 1.1  PROT 7.3  ALBUMIN 3.2*   No results for input(s): "LIPASE", "AMYLASE" in the last 168 hours. No results for input(s): "AMMONIA" in the last 168 hours. Coagulation Profile: No results for input(s): "INR", "PROTIME" in the last 168 hours. Cardiac Enzymes: No results for input(s): "CKTOTAL", "CKMB", "CKMBINDEX", "TROPONINI" in the last 168 hours. BNP (last 3 results) No results for input(s): "PROBNP" in the last 8760 hours. HbA1C: No results for input(s): "HGBA1C" in the last 72 hours. CBG: No results for input(s): "GLUCAP" in the last 168 hours. Lipid Profile: No results for input(s): "CHOL", "HDL", "LDLCALC", "TRIG", "CHOLHDL", "LDLDIRECT" in the  last 72 hours. Thyroid Function Tests: No results for input(s): "TSH", "T4TOTAL", "FREET4", "T3FREE", "THYROIDAB" in the last 72 hours. Anemia Panel: No results for input(s): "VITAMINB12", "FOLATE", "FERRITIN", "TIBC", "IRON", "RETICCTPCT" in the last 72 hours. Urine analysis: No results found for: "COLORURINE", "APPEARANCEUR", "LABSPEC", "PHURINE", "GLUCOSEU", "HGBUR", "BILIRUBINUR", "KETONESUR", "PROTEINUR", "UROBILINOGEN", "NITRITE", "LEUKOCYTESUR"  Radiological Exams on Admission: DG Chest Portable 1 View  Result Date: 09/18/2021 CLINICAL DATA:  Tachycardia EXAM: PORTABLE CHEST 1 VIEW COMPARISON:  09/07/2021 FINDINGS: The heart size and mediastinal contours are within normal limits. Patchy right lower lobe airspace opacity with small right pleural effusion. Possible trace left pleural effusion. No pneumothorax. The visualized skeletal structures are unremarkable. IMPRESSION: Patchy  right lower lobe airspace opacity with small right pleural effusion. Possible trace left pleural effusion. Electronically Signed   By: Duanne Guess D.O.   On: 09/18/2021 15:50    EKG: Independently reviewed.  Rapid atrial flutter  Assessment/Plan Principal Problem:   Atrial flutter, unspecified type (HCC) Active Problems:   Hx pulmonary embolism   Hyperlipidemia   GI bleed  Atrial flutter with rapid ventricular response Relatively asymptomatic, patient's only complaint is fatigue Plan: Admit to progressive Telemetry monitoring Continue diltiazem gtt. Add metoprolol 25 mg p.o. twice daily Metoprolol 5 mg IV push for sustained tachycardia Rule out pulmonary embolism with VQ scan Cardiology consult, message sent to Dr. Gwen Pounds  History of pulmonary embolism Occurred in 2004.  Patient was anticoagulated for short period of time. Plan: Patient with a severe contrast allergy.  Will order VQ scan  Essential hypertension Home meds held  History of gout PTA colchicine  History of GI bleed in  2020 Defer all therapeutic anticoagulation for now    DVT prophylaxis: SQ Lovenox Code Status: Full Family Communication: Spouse at bedside Disposition Plan: Anticipate return to previous home environment deaf Consults called: Cardiology-[facility].  Dr. Gwen Pounds Admission status: Observation, progressive   Tresa Moore MD [facility]   If 7PM-7AM, please contact night-coverage   09/18/2021, 6:18 PM

## 2021-09-18 NOTE — ED Provider Notes (Signed)
[facility] Provider Note    Event Date/Time   First MD Initiated Contact with Patient 09/18/21 1507     (approximate)   History   Tachycardia   HPI  Kevin Acosta is a 76 y.o. male with recent mission the hospital for chest discomfort and diagnosis of pericarditis presents to the ER today due to several days roughly started on Monday of fatigue.  Had follow-up in cardiology clinic today was found to be tachycardic heart rates in the 160s.  He is on any blood thinners.  No new medications other than colchicine.  Denies any chest pain or pressure no shortness of breath.  No pleuritic pain.  No leg swelling.     Physical Exam   Triage Vital Signs: ED Triage Vitals [09/18/21 1506]  Enc Vitals Group     BP      Pulse      Resp      Temp      Temp src      SpO2      Weight 160 lb 0.9 oz (72.6 kg)     Height 5\' 10"  (1.778 m)     Head Circumference      Peak Flow      Pain Score 0     Pain Loc      Pain Edu?      Excl. in GC?     Most recent vital signs: Vitals:   09/18/21 1915 09/18/21 1917  BP: 125/75   Pulse: 67   Resp: 19   Temp:  98.4 F (36.9 C)  SpO2: 97%      Constitutional: Alert  Eyes: Conjunctivae are normal.  Head: Atraumatic. Nose: No congestion/rhinnorhea. Mouth/Throat: Mucous membranes are moist.   Neck: Painless ROM.  Cardiovascular:   tachycardic, regular. Good peripheral circulation. Respiratory: Normal respiratory effort.  No retractions.  Gastrointestinal: Soft and nontender.  Musculoskeletal:  no deformity Neurologic:  MAE spontaneously. No gross focal neurologic deficits are appreciated.  Skin:  Skin is warm, dry and intact. No rash noted. Psychiatric: Mood and affect are normal. Speech and behavior are normal.    ED Results / Procedures / Treatments   Labs (all labs ordered are listed, but only abnormal results are displayed) Labs Reviewed  CBC WITH DIFFERENTIAL/PLATELET - Abnormal; Notable for the  following components:      Result Value   WBC 12.4 (*)    Neutro Abs 8.9 (*)    Monocytes Absolute 1.1 (*)    All other components within normal limits  COMPREHENSIVE METABOLIC PANEL - Abnormal; Notable for the following components:   Potassium 3.4 (*)    Glucose, Bld 157 (*)    Calcium 8.7 (*)    Albumin 3.2 (*)    ALT 47 (*)    All other components within normal limits  SARS CORONAVIRUS 2 BY RT PCR  MAGNESIUM  BASIC METABOLIC PANEL  CBC     EKG  ED ECG REPORT I, 11/18/21, the attending physician, personally viewed and interpreted this ECG.   Date: 09/18/2021  EKG Time: 15:08  Rate: 165  Rhythm: aflutter with rvr  Axis: normal  Intervals: normal   ST&T Change: no stemi, nonspecific st abn likely rate dependent    RADIOLOGY Please see ED Course for my review and interpretation.  I personally reviewed all radiographic images ordered to evaluate for the above acute complaints and reviewed radiology reports and findings.  These findings were personally discussed with the  patient.  Please see medical record for radiology report.    PROCEDURES:  Critical Care performed: Yes, see critical care procedure note(s)  .Critical Care  Performed by: Willy Eddy, MD Authorized by: Willy Eddy, MD   Critical care provider statement:    Critical care time (minutes):  35   Critical care was necessary to treat or prevent imminent or life-threatening deterioration of the following conditions:  Circulatory failure   Critical care was time spent personally by me on the following activities:  Ordering and performing treatments and interventions, ordering and review of laboratory studies, ordering and review of radiographic studies, pulse oximetry, re-evaluation of patient's condition, review of old charts, obtaining history from patient or surrogate, examination of patient, evaluation of patient's response to treatment, discussions with primary provider, discussions  with consultants and development of treatment plan with patient or surrogate    MEDICATIONS ORDERED IN ED: Medications  diltiazem (CARDIZEM) 125 mg in dextrose 5% 125 mL (1 mg/mL) infusion (15 mg/hr Intravenous Rate/Dose Change 09/18/21 1744)  colchicine tablet 0.6 mg (has no administration in time range)  acetaminophen (TYLENOL) tablet 650 mg (has no administration in time range)  ondansetron (ZOFRAN) injection 4 mg (has no administration in time range)  enoxaparin (LOVENOX) injection 40 mg (has no administration in time range)  metoprolol tartrate (LOPRESSOR) tablet 25 mg (has no administration in time range)  ALPRAZolam (XANAX) tablet 0.25 mg (has no administration in time range)  metoprolol tartrate (LOPRESSOR) injection 5 mg (has no administration in time range)  diltiazem (CARDIZEM CD) 24 hr capsule 240 mg (has no administration in time range)  metoprolol tartrate (LOPRESSOR) injection 5 mg (5 mg Intravenous Given 09/18/21 1530)  sodium chloride 0.9 % bolus 1,000 mL (0 mLs Intravenous Stopped 09/18/21 1638)  diltiazem (CARDIZEM) injection 15 mg (15 mg Intravenous Given 09/18/21 1638)  metoprolol tartrate (LOPRESSOR) injection 5 mg (5 mg Intravenous Given 09/18/21 1807)  0.9 %  sodium chloride infusion (0 mLs Intravenous Stopped 09/18/21 1918)  sodium chloride 0.9 % bolus 1,000 mL (1,000 mLs Intravenous New Bag/Given 09/18/21 1923)     IMPRESSION / MDM / ASSESSMENT AND PLAN / ED COURSE  I reviewed the triage vital signs and the nursing notes.                              Differential diagnosis includes, but is not limited to, dysrhythmia, electrolyte abnormality, PE, dehydration, medication effect, sepsis, ACS  Patient presented to the ER for evaluation of symptoms as described above.  Patient well-appearing clinically but significantly tachycardic.  This presenting complaint could reflect a potentially life-threatening illness therefore the patient will be placed on continuous pulse  oximetry and telemetry for monitoring.  Laboratory evaluation will be sent to evaluate for the above complaints.      Clinical Course as of 09/18/21 1947  Thu Sep 18, 2021  1530 Chest x-ray on my review and interpretation does not show any evidence of infiltrates or consolidation. [PR]  1618 Patient had some minimal change in his heart rate with Lopressor as well as IV fluids.  I consult with cardiology who recommends combination beta-blocker plus Cardizem.  Will be given Cardizem bolus and infusion. [PR]  1744 Patient with persistent tachycardia despite increasing Cardizem we will give additional Lopressor.  At this point do believe patient will require hospitalization for IV rate control.  Patient agreeable to plan.  Have consulted hospitalist for admission. [PR]  Clinical Course User Index [PR] Willy Eddy, MD     FINAL CLINICAL IMPRESSION(S) / ED DIAGNOSES   Final diagnoses:  Atrial flutter with rapid ventricular response [facility])     Rx / DC Orders   ED Discharge Orders          Ordered    Amb referral to [facility]        09/18/21 1807             Note:  This document was prepared using Dragon voice recognition software and may include unintentional dictation errors.    Willy Eddy, MD 09/18/21 [phone]

## 2021-09-18 NOTE — ED Notes (Signed)
Pt eating dinner. Pharm messaged to send cardiazem

## 2021-09-19 ENCOUNTER — Other Ambulatory Visit (HOSPITAL_COMMUNITY): Payer: Self-pay

## 2021-09-19 ENCOUNTER — Observation Stay: Payer: Medicare Other

## 2021-09-19 ENCOUNTER — Telehealth (HOSPITAL_COMMUNITY): Payer: Self-pay | Admitting: Pharmacy Technician

## 2021-09-19 DIAGNOSIS — I4892 Unspecified atrial flutter: Secondary | ICD-10-CM | POA: Diagnosis not present

## 2021-09-19 DIAGNOSIS — I309 Acute pericarditis, unspecified: Secondary | ICD-10-CM | POA: Diagnosis not present

## 2021-09-19 DIAGNOSIS — I483 Typical atrial flutter: Secondary | ICD-10-CM | POA: Diagnosis not present

## 2021-09-19 LAB — CBC
HCT: 37.1 % — ABNORMAL LOW (ref 39.0–52.0)
Hemoglobin: 11.9 g/dL — ABNORMAL LOW (ref 13.0–17.0)
MCH: 29.4 pg (ref 26.0–34.0)
MCHC: 32.1 g/dL (ref 30.0–36.0)
MCV: 91.6 fL (ref 80.0–100.0)
Platelets: 339 10*3/uL (ref 150–400)
RBC: 4.05 MIL/uL — ABNORMAL LOW (ref 4.22–5.81)
RDW: 13.1 % (ref 11.5–15.5)
WBC: 12.2 10*3/uL — ABNORMAL HIGH (ref 4.0–10.5)
nRBC: 0 % (ref 0.0–0.2)

## 2021-09-19 LAB — BASIC METABOLIC PANEL
Anion gap: 5 (ref 5–15)
BUN: 19 mg/dL (ref 8–23)
CO2: 24 mmol/L (ref 22–32)
Calcium: 7.9 mg/dL — ABNORMAL LOW (ref 8.9–10.3)
Chloride: 110 mmol/L (ref 98–111)
Creatinine, Ser: 0.9 mg/dL (ref 0.61–1.24)
GFR, Estimated: >60 mL/min (ref >60)
Glucose, Bld: 123 mg/dL — ABNORMAL HIGH (ref 70–99)
Potassium: 3.5 mmol/L (ref 3.5–5.1)
Sodium: 139 mmol/L (ref 135–145)

## 2021-09-19 MED ORDER — APIXABAN 5 MG PO TABS: 5.0000 mg | ORAL_TABLET | Freq: Two times a day (BID) | ORAL | 0 refills | Status: DC

## 2021-09-19 MED ORDER — PANTOPRAZOLE SODIUM 40 MG PO TBEC: 40.0000 mg | DELAYED_RELEASE_TABLET | Freq: Every day | ORAL | 1 refills | Status: AC

## 2021-09-19 MED ORDER — METOPROLOL TARTRATE 25 MG PO TABS: 25.0000 mg | ORAL_TABLET | Freq: Two times a day (BID) | ORAL | 1 refills | Status: DC

## 2021-09-19 MED ORDER — PANTOPRAZOLE SODIUM 40 MG PO TBEC: 40.0000 mg | DELAYED_RELEASE_TABLET | Freq: Every day | ORAL | 1 refills | Status: DC

## 2021-09-19 MED ORDER — DILTIAZEM HCL ER COATED BEADS 240 MG PO CP24: 240.0000 mg | ORAL_CAPSULE | Freq: Every day | ORAL | 1 refills | Status: DC

## 2021-09-19 MED ORDER — APIXABAN 5 MG PO TABS: 5.0000 mg | ORAL_TABLET | Freq: Two times a day (BID) | ORAL | 0 refills | Status: AC

## 2021-09-19 MED ORDER — APIXABAN 5 MG PO TABS: 5.0000 mg | ORAL_TABLET | Freq: Two times a day (BID) | ORAL | Status: DC

## 2021-09-19 MED ORDER — PANTOPRAZOLE SODIUM 40 MG PO TBEC
40.0000 mg | DELAYED_RELEASE_TABLET | Freq: Every day | ORAL | Status: DC
  Administered 2021-09-19: 40 mg via ORAL
  Filled 2021-09-19: qty 1

## 2021-09-19 NOTE — Progress Notes (Signed)
  Transition of Care [facility]) Screening Note   Patient Details  Name: Kevin Acosta Date of Birth: [birth year]   Transition of Care [facility]) CM/SW Contact:    Gildardo Griffes, LCSW Phone Number: 09/19/2021, 11:32 AM    Transition of Care Department [name]) has reviewed patient and no TOC needs have been identified at this time. We will continue to monitor patient advancement through interdisciplinary progression rounds. If new patient transition needs arise, please place a TOC consult.  [name], [place] [phone]

## 2021-09-19 NOTE — TOC Benefit Eligibility Note (Signed)
Patient Advocate Encounter  Insurance verification completed.    The patient is currently admitted and upon discharge could be taking Eliquis 5 mg.  The current 30 day co-pay is $40.00.   The patient is insured through [employer] Part D     Yukari Flax, CPhT Pharmacy Patient Advocate Specialist [facility] Patient Advocate Team Direct Number: [phone]  Fax: [phone]        

## 2021-09-19 NOTE — Telephone Encounter (Signed)
Pharmacy Patient Advocate Encounter  Insurance verification completed.    The patient is insured through [employer] Part D   The patient is currently admitted and ran test claims for the following: Eliquis .  Copays and coinsurance results were relayed to Inpatient clinical team.      

## 2021-09-19 NOTE — Progress Notes (Signed)
[employer] - Patient with history of GI bleed put on VTE prevention lovenox   Action - PPI daily for SUP  Response - patient agreed to lovenox therapy

## 2021-09-19 NOTE — Discharge Summary (Signed)
Physician Discharge Summary  Kevin Acosta:[id number] DOB: [birth year] DOA: 09/18/2021  PCP: Marina Goodell, MD  Admit date: 09/18/2021 Discharge date: 09/19/2021  Admitted From: Home Disposition: Home  Recommendations for Outpatient Follow-up:  Follow up with PCP in 1-2 weeks Follow-up with Dr. Darrold Junker in 1 week  Home Health: No Equipment/Devices: None  Discharge Condition: Stable CODE STATUS: Full  Diet recommendation: Heart healthy  Brief/Interim Summary: 76 y.o. male with medical history significant of PE in 2004 no longer on anticoagulation, history of GI bleed in 2020, hypertension, hyperlipidemia who presents to the cardiologist office for chief complaint of tachycardia and fatigue.   Patient states that he has been abnormally fatigued over the past few days.  Presented to [facility] cardiology office.  Was found to be markedly tachycardic and was sent to the emergency room.  In the emergency room twelve-lead EKG confirms rapid atrial flutter.  Started on diltiazem infusion.  This was at max rate with heart rate still in the 170s.  Cardiology contacted by EDP.  Recommended addition of beta-blocker to medication regimen.  This has been added but has yet to been effective at this time. On my evaluation patient is overall asymptomatic.  He is resting comfortably in bed.  He is in no visible distress.  Vital signs apart from the tachycardia are essentially normal.  Was maintained on Cardizem gtt., p.o. Cardizem, p.o. metoprolol overnight.  Heart rate improved patient remained in atrial flutter.  Seen and evaluated by cardiology on 8/11.  As heart rate control is improved will defer cardioversion for now.  Recommend initiation of Eliquis 5 mg twice daily for VTE prophylaxis.  Stable for discharge.  Will recommend Eliquis, Cardizem CD 240 mg daily, metoprolol titrate 25 mg twice daily.  P.o. Protonix for stress ulcer prophylaxis.  Follow-up outpatient PCP and  cardiology.    Discharge Diagnoses:  Principal Problem:   Atrial flutter, unspecified type (HCC) Active Problems:   Hx pulmonary embolism   Hyperlipidemia   GI bleed  Atrial flutter with rapid ventricular response Relatively asymptomatic, patient's only complaint is fatigue Improved with p.o. and IV Cardizem and p.o. metoprolol Remained in atrial flutter at time of discharge Symptoms improved Ambulating without significant escalation of heart rate Plan: Discharge home.  Cardizem to CD2 140 mg daily prescribed.  Metoprolol tartrate 25 mg twice daily prescribed.  Eliquis 5 mg twice daily.  P.o. Protonix for stress ulcer prophylaxis.  Advised to monitor for any bleeding and to discontinue Eliquis and seek medical attention should any GI bleeding recur.  Patient and wife at bedside at time of discharge.  Both expressed understanding.  All questions answered   Discharge Instructions  Discharge Instructions     Amb referral to [facility]   Complete by: As directed    Diet - low sodium heart healthy   Complete by: As directed    Increase activity slowly   Complete by: As directed       Allergies as of 09/19/2021       Reactions   Contrast Media [iodinated Contrast Media] Other (See Comments)   Hypotension, Vagal, "coded"        Medication List     STOP taking these medications    benazepril 5 MG tablet Commonly known as: LOTENSIN   benzonatate 200 MG capsule Commonly known as: TESSALON   losartan 25 MG tablet Commonly known as: COZAAR       TAKE these medications    apixaban 5 MG Tabs  tablet Commonly known as: ELIQUIS Take 1 tablet (5 mg total) by mouth 2 (two) times daily.   ascorbic acid 500 MG tablet Commonly known as: VITAMIN C Take 500 mg by mouth daily.   Centrum Silver 50+Men Tabs Take 1 tablet by mouth daily.   colchicine 0.6 MG tablet Take 1 tablet (0.6 mg total) by mouth 2 (two) times daily.   diltiazem 240 MG 24 hr capsule Commonly  known as: CARDIZEM CD Take 1 capsule (240 mg total) by mouth daily. Start taking on: September 20, 2021   Fish Oil Omega-3 1000 MG Caps Take 1 capsule by mouth daily.   metoprolol tartrate 25 MG tablet Commonly known as: LOPRESSOR Take 1 tablet (25 mg total) by mouth 2 (two) times daily.   pantoprazole 40 MG tablet Commonly known as: PROTONIX Take 1 tablet (40 mg total) by mouth daily. Start taking on: September 20, 2021   Vitamin D3 25 MCG (1000 UT) Caps Take 1,000 Units by mouth daily.   Zinc 50 MG Tabs Take 50 mg by mouth daily.        Follow-up Information     Paraschos, Alexander, MD. Go in 1 week(s).   Specialty: Cardiology Why: Appointment on Monday, 09/22/2021 at 2:30pm Contact information: [address] [place] [facility] [place] [address] [phone]                Allergies  Allergen Reactions   Contrast Media [Iodinated Contrast Media] Other (See Comments)    Hypotension, Vagal, "coded"    Consultations: Cardiology-[facility]   Procedures/Studies: DG Chest Portable 1 View  Result Date: 09/18/2021 CLINICAL DATA:  Tachycardia EXAM: PORTABLE CHEST 1 VIEW COMPARISON:  09/07/2021 FINDINGS: The heart size and mediastinal contours are within normal limits. Patchy right lower lobe airspace opacity with small right pleural effusion. Possible trace left pleural effusion. No pneumothorax. The visualized skeletal structures are unremarkable. IMPRESSION: Patchy right lower lobe airspace opacity with small right pleural effusion. Possible trace left pleural effusion. Electronically Signed   By: Duanne Guess D.O.   On: 09/18/2021 15:50   ECHOCARDIOGRAM COMPLETE  Result Date: 09/08/2021    ECHOCARDIOGRAM REPORT   Patient Name:   Kevin Acosta Date of Exam: 09/08/2021 Medical Rec #:  [id number]        Height:       70.0 in Accession #:    [id number]       Weight:       160.0 lb Date of Birth:  Nov 29, 1945        BSA:          1.898 m  Patient Age:    76 years         BP:           122/80 mmHg Patient Gender: M                HR:           95 bpm. Exam Location:  [facility] Procedure: 2D Echo, Cardiac Doppler and Color Doppler Indications:     Chest pain R07.9  History:         Patient has no prior history of Echocardiogram examinations.                  Risk Factors:Dyslipidemia and Hypertension. PE.  Sonographer:     Cristela Blue Referring Phys:  [id number] Brien Few NIU Diagnosing Phys: Arnoldo Hooker MD  Sonographer Comments: Image quality was fair. IMPRESSIONS  1. Left  ventricular ejection fraction, by estimation, is 65 to 70%. The left ventricle has normal function. The left ventricle has no regional wall motion abnormalities. Left ventricular diastolic parameters were normal.  2. Right ventricular systolic function is normal. The right ventricular size is normal.  3. The mitral valve is normal in structure. Trivial mitral valve regurgitation.  4. The aortic valve is normal in structure. Aortic valve regurgitation is not visualized. FINDINGS  Left Ventricle: Left ventricular ejection fraction, by estimation, is 65 to 70%. The left ventricle has normal function. The left ventricle has no regional wall motion abnormalities. The left ventricular internal cavity size was normal in size. There is  no left ventricular hypertrophy. Left ventricular diastolic parameters were normal. Right Ventricle: The right ventricular size is normal. No increase in right ventricular wall thickness. Right ventricular systolic function is normal. Left Atrium: Left atrial size was normal in size. Right Atrium: Right atrial size was normal in size. Pericardium: There is no evidence of pericardial effusion. Mitral Valve: The mitral valve is normal in structure. Trivial mitral valve regurgitation. Tricuspid Valve: The tricuspid valve is normal in structure. Tricuspid valve regurgitation is trivial. Aortic Valve: The aortic valve is normal in structure. Aortic valve regurgitation is not  visualized. Aortic valve mean gradient measures 3.5 mmHg. Aortic valve peak gradient measures 6.1 mmHg. Aortic valve area, by VTI measures 3.41 cm. Pulmonic Valve: The pulmonic valve was normal in structure. Pulmonic valve regurgitation is not visualized. Aorta: The aortic root and ascending aorta are structurally normal, with no evidence of dilitation. IAS/Shunts: No atrial level shunt detected by color flow Doppler.  LEFT VENTRICLE PLAX 2D LVIDd:         4.00 cm   Diastology LVIDs:         2.40 cm   LV e' medial:    7.18 cm/s LV PW:         1.30 cm   LV E/e' medial:  10.6 LV IVS:        1.00 cm   LV e' lateral:   6.20 cm/s LVOT diam:     2.00 cm   LV E/e' lateral: 12.2 LV SV:         67 LV SV Index:   35 LVOT Area:     3.14 cm  RIGHT VENTRICLE RV Basal diam:  3.10 cm RV S prime:     16.40 cm/s TAPSE (M-mode): 2.3 cm LEFT ATRIUM             Index        RIGHT ATRIUM           Index LA diam:        2.60 cm 1.37 cm/m   RA Area:     12.70 cm LA Vol (A2C):   47.3 ml 24.91 ml/m  RA Volume:   30.60 ml  16.12 ml/m LA Vol (A4C):   46.5 ml 24.49 ml/m LA Biplane Vol: 47.8 ml 25.18 ml/m  AORTIC VALVE AV Area (Vmax):    3.04 cm AV Area (Vmean):   2.93 cm AV Area (VTI):     3.41 cm AV Vmax:           123.00 cm/s AV Vmean:          87.550 cm/s AV VTI:            0.196 m AV Peak Grad:      6.1 mmHg AV Mean Grad:      3.5 mmHg LVOT Vmax:  119.00 cm/s LVOT Vmean:        81.700 cm/s LVOT VTI:          0.213 m LVOT/AV VTI ratio: 1.09  AORTA Ao Root diam: 3.35 cm MITRAL VALVE               TRICUSPID VALVE MV Area (PHT): 3.58 cm    TR Peak grad:   14.0 mmHg MV Decel Time: 212 msec    TR Vmax:        187.00 cm/s MV E velocity: 75.80 cm/s MV A velocity: 99.80 cm/s  SHUNTS MV E/A ratio:  0.76        Systemic VTI:  0.21 m                            Systemic Diam: 2.00 cm Arnoldo Hooker MD Electronically signed by Arnoldo Hooker MD Signature Date/Time: 09/08/2021/12:00:53 PM    Final    US Venous Img Lower Bilateral  (DVT)  Result Date: 09/07/2021 CLINICAL DATA:  Positive D-dimer EXAM: BILATERAL LOWER EXTREMITY VENOUS DOPPLER ULTRASOUND TECHNIQUE: Gray-scale sonography with compression, as well as color and duplex ultrasound, were performed to evaluate the deep venous system(s) from the level of the common femoral vein through the popliteal and proximal calf veins. COMPARISON:  None Available. FINDINGS: VENOUS Normal compressibility of the common femoral, superficial femoral, and popliteal veins, as well as the visualized calf veins. Visualized portions of profunda femoral vein and great saphenous vein unremarkable. No filling defects to suggest DVT on grayscale or color Doppler imaging. Doppler waveforms show normal direction of venous flow, normal respiratory plasticity and response to augmentation. Limited views of the contralateral common femoral vein are unremarkable. OTHER None. Limitations: none IMPRESSION: Negative. Electronically Signed   By: Gerome Sam III M.D.   On: 09/07/2021 12:59   NM Pulmonary Perfusion  Result Date: 09/07/2021 CLINICAL DATA:  Pulmonary embolism (PE) suspected, high prob EXAM: NUCLEAR MEDICINE PERFUSION LUNG SCAN TECHNIQUE: Perfusion images were obtained in multiple projections after intravenous injection of radiopharmaceutical. Ventilation scans intentionally deferred if perfusion scan and chest x-ray adequate for interpretation during COVID 19 epidemic. RADIOPHARMACEUTICALS:  4.32 mCi Tc-43m MAA IV COMPARISON:  Same day chest x-ray FINDINGS: Physiologic distribution of radiotracer throughout both lungs. No segmental or larger perfusion defect. IMPRESSION: Low probability for pulmonary embolism. Electronically Signed   By: Duanne Guess D.O.   On: 09/07/2021 11:27   DG Chest Portable 1 View  Result Date: 09/07/2021 CLINICAL DATA:  76 year old male with chest pain radiating to the back. Shortness of breath. EXAM: PORTABLE CHEST 1 VIEW COMPARISON:  Chest radiographs 09/04/2021 and  earlier. FINDINGS: Portable AP upright view at 0805 hours. Lower lung volumes with mild crowding of markings. Mediastinal contours remain within normal limits. Visualized tracheal air column is within normal limits. No pneumothorax, pleural effusion or confluent pulmonary opacity. No pulmonary edema suspected. Stable visualized osseous structures. Negative visible bowel gas. IMPRESSION: Lower lung volumes.  No acute cardiopulmonary abnormality. Electronically Signed   By: Odessa Fleming M.D.   On: 09/07/2021 08:19   DG Chest 2 View  Result Date: 09/04/2021 CLINICAL DATA:  Fall pneumonia EXAM: CHEST - 2 VIEW COMPARISON:  05/17/2021 FINDINGS: The heart size and mediastinal contours are within normal limits. Interval resolution of previously seen right perihilar airspace opacity. Both lungs are clear. The visualized skeletal structures are unremarkable. IMPRESSION: No active cardiopulmonary disease. Interval resolution of previously seen right perihilar airspace opacity.  Electronically Signed   By: Duanne Guess D.O.   On: 09/04/2021 15:56      Subjective: Seen and examined the day of discharge.  Stable no distress, heart rate control improved.  Fatigue improved.  Stable for discharge.  Discharge Exam: Vitals:   09/19/21 0759 09/19/21 1213  BP: 100/64 104/72  Pulse: (!) 108 84  Resp: 20 20  Temp: 98.2 F (36.8 C) 98.2 F (36.8 C)  SpO2: 92% 94%   Vitals:   09/19/21 0442 09/19/21 0518 09/19/21 0759 09/19/21 1213  BP: 94/65 113/73 100/64 104/72  Pulse: 87 82 (!) 108 84  Resp: 17 19 20 20   Temp: 98.5 F (36.9 C) 98.2 F (36.8 C) 98.2 F (36.8 C) 98.2 F (36.8 C)  TempSrc: Oral Oral    SpO2: 93% 95% 92% 94%  Weight:      Height:        General: Pt is alert, awake, not in acute distress Cardiovascular: Tachycardic, regular rhythm, no murmurs Respiratory: CTA bilaterally, no wheezing, no rhonchi Abdominal: Soft, NT, ND, bowel sounds + Extremities: no edema, no cyanosis    The results  of significant diagnostics from this hospitalization (including imaging, microbiology, ancillary and laboratory) are listed below for reference.     Microbiology: Recent Results (from the past 240 hour(s))  SARS Coronavirus 2 by RT PCR (hospital order, performed in [facility] hospital lab) *cepheid single result test* Anterior Nasal Swab     Status: None   Collection Time: 09/18/21  4:12 PM   Specimen: Anterior Nasal Swab  Result Value Ref Range Status   SARS Coronavirus 2 by RT PCR NEGATIVE NEGATIVE Final    Comment: (NOTE) SARS-CoV-2 target nucleic acids are NOT DETECTED.  The SARS-CoV-2 RNA is generally detectable in upper and lower respiratory specimens during the acute phase of infection. The lowest concentration of SARS-CoV-2 viral copies this assay can detect is 250 copies / mL. A negative result does not preclude SARS-CoV-2 infection and should not be used as the sole basis for treatment or other patient management decisions.  A negative result may occur with improper specimen collection / handling, submission of specimen other than nasopharyngeal swab, presence of viral mutation(s) within the areas targeted by this assay, and inadequate number of viral copies (<250 copies / mL). A negative result must be combined with clinical observations, patient history, and epidemiological information.  Fact Sheet for Patients:   11/18/21  Fact Sheet for Healthcare Providers: RoadLapTop.co.za  This test is not yet approved or  cleared by the http://kim-miller.com/ FDA and has been authorized for detection and/or diagnosis of SARS-CoV-2 by FDA under an Emergency Use Authorization (EUA).  This EUA will remain in effect (meaning this test can be used) for the duration of the COVID-19 declaration under Section 564(b)(1) of the Act, 21 U.S.C. section 360bbb-3(b)(1), unless the authorization is terminated or revoked sooner.  Performed at  [facility], [address] [place], [place]      Labs: BNP (last 3 results) No results for input(s): "BNP" in the last 8760 hours. Basic Metabolic Panel: Recent Labs  Lab 09/18/21 1517 09/18/21 1606 09/19/21 0509  NA 138  --  139  K 3.4*  --  3.5  CL 101  --  110  CO2 24  --  24  GLUCOSE 157*  --  123*  BUN 20  --  19  CREATININE 1.11  --  0.90  CALCIUM 8.7*  --  7.9*  MG  --  2.2  --    Liver Function Tests: Recent Labs  Lab 09/18/21 1517  AST 32  ALT 47*  ALKPHOS 68  BILITOT 1.1  PROT 7.3  ALBUMIN 3.2*   No results for input(s): "LIPASE", "AMYLASE" in the last 168 hours. No results for input(s): "AMMONIA" in the last 168 hours. CBC: Recent Labs  Lab 09/18/21 1517 09/19/21 0509  WBC 12.4* 12.2*  NEUTROABS 8.9*  --   HGB 13.9 11.9*  HCT 42.9 37.1*  MCV 93.3 91.6  PLT 377 339   Cardiac Enzymes: No results for input(s): "CKTOTAL", "CKMB", "CKMBINDEX", "TROPONINI" in the last 168 hours. BNP: Invalid input(s): "POCBNP" CBG: No results for input(s): "GLUCAP" in the last 168 hours. D-Dimer No results for input(s): "DDIMER" in the last 72 hours. Hgb A1c No results for input(s): "HGBA1C" in the last 72 hours. Lipid Profile No results for input(s): "CHOL", "HDL", "LDLCALC", "TRIG", "CHOLHDL", "LDLDIRECT" in the last 72 hours. Thyroid function studies No results for input(s): "TSH", "T4TOTAL", "T3FREE", "THYROIDAB" in the last 72 hours.  Invalid input(s): "FREET3" Anemia work up No results for input(s): "VITAMINB12", "FOLATE", "FERRITIN", "TIBC", "IRON", "RETICCTPCT" in the last 72 hours. Urinalysis No results found for: "COLORURINE", "APPEARANCEUR", "LABSPEC", "PHURINE", "GLUCOSEU", "HGBUR", "BILIRUBINUR", "KETONESUR", "PROTEINUR", "UROBILINOGEN", "NITRITE", "LEUKOCYTESUR" Sepsis Labs Recent Labs  Lab 09/18/21 1517 09/19/21 0509  WBC 12.4* 12.2*   Microbiology Recent Results (from the past 240 hour(s))  SARS Coronavirus 2 by  RT PCR (hospital order, performed in [facility] hospital lab) *cepheid single result test* Anterior Nasal Swab     Status: None   Collection Time: 09/18/21  4:12 PM   Specimen: Anterior Nasal Swab  Result Value Ref Range Status   SARS Coronavirus 2 by RT PCR NEGATIVE NEGATIVE Final    Comment: (NOTE) SARS-CoV-2 target nucleic acids are NOT DETECTED.  The SARS-CoV-2 RNA is generally detectable in upper and lower respiratory specimens during the acute phase of infection. The lowest concentration of SARS-CoV-2 viral copies this assay can detect is 250 copies / mL. A negative result does not preclude SARS-CoV-2 infection and should not be used as the sole basis for treatment or other patient management decisions.  A negative result may occur with improper specimen collection / handling, submission of specimen other than nasopharyngeal swab, presence of viral mutation(s) within the areas targeted by this assay, and inadequate number of viral copies (<250 copies / mL). A negative result must be combined with clinical observations, patient history, and epidemiological information.  Fact Sheet for Patients:   RoadLapTop.co.za  Fact Sheet for Healthcare Providers: http://kim-miller.com/  This test is not yet approved or  cleared by the [place] FDA and has been authorized for detection and/or diagnosis of SARS-CoV-2 by FDA under an Emergency Use Authorization (EUA).  This EUA will remain in effect (meaning this test can be used) for the duration of the COVID-19 declaration under Section 564(b)(1) of the Act, 21 U.S.C. section 360bbb-3(b)(1), unless the authorization is terminated or revoked sooner.  Performed at [facility], [address], [place], [place] [address]      Time coordinating discharge: Over 30 minutes  SIGNED:   Tresa Moore, MD  [facility] 09/19/2021, 2:10 PM Pager   If 7PM-7AM, please  contact night-coverage

## 2021-09-19 NOTE — Progress Notes (Deleted)
Physician Discharge Summary  Kevin Acosta PIR:[id number] DOB: [birth year] DOA: 09/18/2021  PCP: Marina Goodell, MD  Admit date: 09/18/2021 Discharge date: 09/19/2021  Admitted From: Home Disposition: Home  Recommendations for Outpatient Follow-up:  Follow up with PCP in 1-2 weeks Follow-up with Dr. Darrold Junker in 1 week  Home Health: No Equipment/Devices: None  Discharge Condition: Stable CODE STATUS: Full  Diet recommendation: Heart healthy  Brief/Interim Summary: 76 y.o. male with medical history significant of PE in 2004 no longer on anticoagulation, history of GI bleed in 2020, hypertension, hyperlipidemia who presents to the cardiologist office for chief complaint of tachycardia and fatigue.   Patient states that he has been abnormally fatigued over the past few days.  Presented to [facility] cardiology office.  Was found to be markedly tachycardic and was sent to the emergency room.  In the emergency room twelve-lead EKG confirms rapid atrial flutter.  Started on diltiazem infusion.  This was at max rate with heart rate still in the 170s.  Cardiology contacted by EDP.  Recommended addition of beta-blocker to medication regimen.  This has been added but has yet to been effective at this time. On my evaluation patient is overall asymptomatic.  He is resting comfortably in bed.  He is in no visible distress.  Vital signs apart from the tachycardia are essentially normal.  Was maintained on Cardizem gtt., p.o. Cardizem, p.o. metoprolol overnight.  Heart rate improved patient remained in atrial flutter.  Seen and evaluated by cardiology on 8/11.  As heart rate control is improved will defer cardioversion for now.  Recommend initiation of Eliquis 5 mg twice daily for VTE prophylaxis.  Stable for discharge.  Will recommend Eliquis, Cardizem CD 240 mg daily, metoprolol titrate 25 mg twice daily.  P.o. Protonix for stress ulcer prophylaxis.  Follow-up outpatient PCP and  cardiology.    Discharge Diagnoses:  Principal Problem:   Atrial flutter, unspecified type (HCC) Active Problems:   Hx pulmonary embolism   Hyperlipidemia   GI bleed  Atrial flutter with rapid ventricular response Relatively asymptomatic, patient's only complaint is fatigue Improved with p.o. and IV Cardizem and p.o. metoprolol Remained in atrial flutter at time of discharge Symptoms improved Ambulating without significant escalation of heart rate Plan: Discharge home.  Cardizem to CD2 140 mg daily prescribed.  Metoprolol tartrate 25 mg twice daily prescribed.  Eliquis 5 mg twice daily.  P.o. Protonix for stress ulcer prophylaxis.  Advised to monitor for any bleeding and to discontinue Eliquis and seek medical attention should any GI bleeding recur.  Patient and wife at bedside at time of discharge.  Both expressed understanding.  All questions answered   Discharge Instructions  Discharge Instructions     Amb referral to [facility]   Complete by: As directed    Diet - low sodium heart healthy   Complete by: As directed    Increase activity slowly   Complete by: As directed       Allergies as of 09/19/2021       Reactions   Contrast Media [iodinated Contrast Media] Other (See Comments)   Hypotension, Vagal, "coded"        Medication List     STOP taking these medications    benazepril 5 MG tablet Commonly known as: LOTENSIN   benzonatate 200 MG capsule Commonly known as: TESSALON   losartan 25 MG tablet Commonly known as: COZAAR       TAKE these medications    apixaban 5 MG Tabs  tablet Commonly known as: ELIQUIS Take 1 tablet (5 mg total) by mouth 2 (two) times daily.   ascorbic acid 500 MG tablet Commonly known as: VITAMIN C Take 500 mg by mouth daily.   Centrum Silver 50+Men Tabs Take 1 tablet by mouth daily.   colchicine 0.6 MG tablet Take 1 tablet (0.6 mg total) by mouth 2 (two) times daily.   diltiazem 240 MG 24 hr capsule Commonly  known as: CARDIZEM CD Take 1 capsule (240 mg total) by mouth daily. Start taking on: September 20, 2021   Fish Oil Omega-3 1000 MG Caps Take 1 capsule by mouth daily.   metoprolol tartrate 25 MG tablet Commonly known as: LOPRESSOR Take 1 tablet (25 mg total) by mouth 2 (two) times daily.   pantoprazole 40 MG tablet Commonly known as: PROTONIX Take 1 tablet (40 mg total) by mouth daily. Start taking on: September 20, 2021   Vitamin D3 25 MCG (1000 UT) Caps Take 1,000 Units by mouth daily.   Zinc 50 MG Tabs Take 50 mg by mouth daily.        Follow-up Information     Paraschos, Alexander, MD. Go in 1 week(s).   Specialty: Cardiology Why: Appointment on Monday, 09/22/2021 at 2:30pm Contact information: [address] [place] [facility] [place] [address] [phone]                Allergies  Allergen Reactions   Contrast Media [Iodinated Contrast Media] Other (See Comments)    Hypotension, Vagal, "coded"    Consultations: Cardiology-[facility]   Procedures/Studies: DG Chest Portable 1 View  Result Date: 09/18/2021 CLINICAL DATA:  Tachycardia EXAM: PORTABLE CHEST 1 VIEW COMPARISON:  09/07/2021 FINDINGS: The heart size and mediastinal contours are within normal limits. Patchy right lower lobe airspace opacity with small right pleural effusion. Possible trace left pleural effusion. No pneumothorax. The visualized skeletal structures are unremarkable. IMPRESSION: Patchy right lower lobe airspace opacity with small right pleural effusion. Possible trace left pleural effusion. Electronically Signed   By: Duanne Guess D.O.   On: 09/18/2021 15:50   ECHOCARDIOGRAM COMPLETE  Result Date: 09/08/2021    ECHOCARDIOGRAM REPORT   Patient Name:   Kevin Acosta Date of Exam: 09/08/2021 Medical Rec #:  [id number]        Height:       70.0 in Accession #:    [id number]       Weight:       160.0 lb Date of Birth:  Nov 29, 1945        BSA:          1.898 m  Patient Age:    76 years         BP:           122/80 mmHg Patient Gender: M                HR:           95 bpm. Exam Location:  [facility] Procedure: 2D Echo, Cardiac Doppler and Color Doppler Indications:     Chest pain R07.9  History:         Patient has no prior history of Echocardiogram examinations.                  Risk Factors:Dyslipidemia and Hypertension. PE.  Sonographer:     Cristela Blue Referring Phys:  [id number] Brien Few NIU Diagnosing Phys: Arnoldo Hooker MD  Sonographer Comments: Image quality was fair. IMPRESSIONS  1. Left  ventricular ejection fraction, by estimation, is 65 to 70%. The left ventricle has normal function. The left ventricle has no regional wall motion abnormalities. Left ventricular diastolic parameters were normal.  2. Right ventricular systolic function is normal. The right ventricular size is normal.  3. The mitral valve is normal in structure. Trivial mitral valve regurgitation.  4. The aortic valve is normal in structure. Aortic valve regurgitation is not visualized. FINDINGS  Left Ventricle: Left ventricular ejection fraction, by estimation, is 65 to 70%. The left ventricle has normal function. The left ventricle has no regional wall motion abnormalities. The left ventricular internal cavity size was normal in size. There is  no left ventricular hypertrophy. Left ventricular diastolic parameters were normal. Right Ventricle: The right ventricular size is normal. No increase in right ventricular wall thickness. Right ventricular systolic function is normal. Left Atrium: Left atrial size was normal in size. Right Atrium: Right atrial size was normal in size. Pericardium: There is no evidence of pericardial effusion. Mitral Valve: The mitral valve is normal in structure. Trivial mitral valve regurgitation. Tricuspid Valve: The tricuspid valve is normal in structure. Tricuspid valve regurgitation is trivial. Aortic Valve: The aortic valve is normal in structure. Aortic valve regurgitation is not  visualized. Aortic valve mean gradient measures 3.5 mmHg. Aortic valve peak gradient measures 6.1 mmHg. Aortic valve area, by VTI measures 3.41 cm. Pulmonic Valve: The pulmonic valve was normal in structure. Pulmonic valve regurgitation is not visualized. Aorta: The aortic root and ascending aorta are structurally normal, with no evidence of dilitation. IAS/Shunts: No atrial level shunt detected by color flow Doppler.  LEFT VENTRICLE PLAX 2D LVIDd:         4.00 cm   Diastology LVIDs:         2.40 cm   LV e' medial:    7.18 cm/s LV PW:         1.30 cm   LV E/e' medial:  10.6 LV IVS:        1.00 cm   LV e' lateral:   6.20 cm/s LVOT diam:     2.00 cm   LV E/e' lateral: 12.2 LV SV:         67 LV SV Index:   35 LVOT Area:     3.14 cm  RIGHT VENTRICLE RV Basal diam:  3.10 cm RV S prime:     16.40 cm/s TAPSE (M-mode): 2.3 cm LEFT ATRIUM             Index        RIGHT ATRIUM           Index LA diam:        2.60 cm 1.37 cm/m   RA Area:     12.70 cm LA Vol (A2C):   47.3 ml 24.91 ml/m  RA Volume:   30.60 ml  16.12 ml/m LA Vol (A4C):   46.5 ml 24.49 ml/m LA Biplane Vol: 47.8 ml 25.18 ml/m  AORTIC VALVE AV Area (Vmax):    3.04 cm AV Area (Vmean):   2.93 cm AV Area (VTI):     3.41 cm AV Vmax:           123.00 cm/s AV Vmean:          87.550 cm/s AV VTI:            0.196 m AV Peak Grad:      6.1 mmHg AV Mean Grad:      3.5 mmHg LVOT Vmax:  119.00 cm/s LVOT Vmean:        81.700 cm/s LVOT VTI:          0.213 m LVOT/AV VTI ratio: 1.09  AORTA Ao Root diam: 3.35 cm MITRAL VALVE               TRICUSPID VALVE MV Area (PHT): 3.58 cm    TR Peak grad:   14.0 mmHg MV Decel Time: 212 msec    TR Vmax:        187.00 cm/s MV E velocity: 75.80 cm/s MV A velocity: 99.80 cm/s  SHUNTS MV E/A ratio:  0.76        Systemic VTI:  0.21 m                            Systemic Diam: 2.00 cm Arnoldo Hooker MD Electronically signed by Arnoldo Hooker MD Signature Date/Time: 09/08/2021/12:00:53 PM    Final    US Venous Img Lower Bilateral  (DVT)  Result Date: 09/07/2021 CLINICAL DATA:  Positive D-dimer EXAM: BILATERAL LOWER EXTREMITY VENOUS DOPPLER ULTRASOUND TECHNIQUE: Gray-scale sonography with compression, as well as color and duplex ultrasound, were performed to evaluate the deep venous system(s) from the level of the common femoral vein through the popliteal and proximal calf veins. COMPARISON:  None Available. FINDINGS: VENOUS Normal compressibility of the common femoral, superficial femoral, and popliteal veins, as well as the visualized calf veins. Visualized portions of profunda femoral vein and great saphenous vein unremarkable. No filling defects to suggest DVT on grayscale or color Doppler imaging. Doppler waveforms show normal direction of venous flow, normal respiratory plasticity and response to augmentation. Limited views of the contralateral common femoral vein are unremarkable. OTHER None. Limitations: none IMPRESSION: Negative. Electronically Signed   By: Gerome Sam III M.D.   On: 09/07/2021 12:59   NM Pulmonary Perfusion  Result Date: 09/07/2021 CLINICAL DATA:  Pulmonary embolism (PE) suspected, high prob EXAM: NUCLEAR MEDICINE PERFUSION LUNG SCAN TECHNIQUE: Perfusion images were obtained in multiple projections after intravenous injection of radiopharmaceutical. Ventilation scans intentionally deferred if perfusion scan and chest x-ray adequate for interpretation during COVID 19 epidemic. RADIOPHARMACEUTICALS:  4.32 mCi Tc-54m MAA IV COMPARISON:  Same day chest x-ray FINDINGS: Physiologic distribution of radiotracer throughout both lungs. No segmental or larger perfusion defect. IMPRESSION: Low probability for pulmonary embolism. Electronically Signed   By: Duanne Guess D.O.   On: 09/07/2021 11:27   DG Chest Portable 1 View  Result Date: 09/07/2021 CLINICAL DATA:  76 year old male with chest pain radiating to the back. Shortness of breath. EXAM: PORTABLE CHEST 1 VIEW COMPARISON:  Chest radiographs 09/04/2021 and  earlier. FINDINGS: Portable AP upright view at 0805 hours. Lower lung volumes with mild crowding of markings. Mediastinal contours remain within normal limits. Visualized tracheal air column is within normal limits. No pneumothorax, pleural effusion or confluent pulmonary opacity. No pulmonary edema suspected. Stable visualized osseous structures. Negative visible bowel gas. IMPRESSION: Lower lung volumes.  No acute cardiopulmonary abnormality. Electronically Signed   By: Odessa Fleming M.D.   On: 09/07/2021 08:19   DG Chest 2 View  Result Date: 09/04/2021 CLINICAL DATA:  Fall pneumonia EXAM: CHEST - 2 VIEW COMPARISON:  05/17/2021 FINDINGS: The heart size and mediastinal contours are within normal limits. Interval resolution of previously seen right perihilar airspace opacity. Both lungs are clear. The visualized skeletal structures are unremarkable. IMPRESSION: No active cardiopulmonary disease. Interval resolution of previously seen right perihilar airspace opacity.  Electronically Signed   By: Duanne Guess D.O.   On: 09/04/2021 15:56      Subjective: Seen and examined the day of discharge.  Stable no distress, heart rate control improved.  Fatigue improved.  Stable for discharge.  Discharge Exam: Vitals:   09/19/21 0759 09/19/21 1213  BP: 100/64 104/72  Pulse: (!) 108 84  Resp: 20 20  Temp: 98.2 F (36.8 C) 98.2 F (36.8 C)  SpO2: 92% 94%   Vitals:   09/19/21 0442 09/19/21 0518 09/19/21 0759 09/19/21 1213  BP: 94/65 113/73 100/64 104/72  Pulse: 87 82 (!) 108 84  Resp: 17 19 20 20   Temp: 98.5 F (36.9 C) 98.2 F (36.8 C) 98.2 F (36.8 C) 98.2 F (36.8 C)  TempSrc: Oral Oral    SpO2: 93% 95% 92% 94%  Weight:      Height:        General: Pt is alert, awake, not in acute distress Cardiovascular: Tachycardic, regular rhythm, no murmurs Respiratory: CTA bilaterally, no wheezing, no rhonchi Abdominal: Soft, NT, ND, bowel sounds + Extremities: no edema, no cyanosis    The results  of significant diagnostics from this hospitalization (including imaging, microbiology, ancillary and laboratory) are listed below for reference.     Microbiology: Recent Results (from the past 240 hour(s))  SARS Coronavirus 2 by RT PCR (hospital order, performed in [facility] hospital lab) *cepheid single result test* Anterior Nasal Swab     Status: None   Collection Time: 09/18/21  4:12 PM   Specimen: Anterior Nasal Swab  Result Value Ref Range Status   SARS Coronavirus 2 by RT PCR NEGATIVE NEGATIVE Final    Comment: (NOTE) SARS-CoV-2 target nucleic acids are NOT DETECTED.  The SARS-CoV-2 RNA is generally detectable in upper and lower respiratory specimens during the acute phase of infection. The lowest concentration of SARS-CoV-2 viral copies this assay can detect is 250 copies / mL. A negative result does not preclude SARS-CoV-2 infection and should not be used as the sole basis for treatment or other patient management decisions.  A negative result may occur with improper specimen collection / handling, submission of specimen other than nasopharyngeal swab, presence of viral mutation(s) within the areas targeted by this assay, and inadequate number of viral copies (<250 copies / mL). A negative result must be combined with clinical observations, patient history, and epidemiological information.  Fact Sheet for Patients:   11/18/21  Fact Sheet for Healthcare Providers: RoadLapTop.co.za  This test is not yet approved or  cleared by the http://kim-miller.com/ FDA and has been authorized for detection and/or diagnosis of SARS-CoV-2 by FDA under an Emergency Use Authorization (EUA).  This EUA will remain in effect (meaning this test can be used) for the duration of the COVID-19 declaration under Section 564(b)(1) of the Act, 21 U.S.C. section 360bbb-3(b)(1), unless the authorization is terminated or revoked sooner.  Performed at  [facility], [address] [place], [place]      Labs: BNP (last 3 results) No results for input(s): "BNP" in the last 8760 hours. Basic Metabolic Panel: Recent Labs  Lab 09/18/21 1517 09/18/21 1606 09/19/21 0509  NA 138  --  139  K 3.4*  --  3.5  CL 101  --  110  CO2 24  --  24  GLUCOSE 157*  --  123*  BUN 20  --  19  CREATININE 1.11  --  0.90  CALCIUM 8.7*  --  7.9*  MG  --  2.2  --    Liver Function Tests: Recent Labs  Lab 09/18/21 1517  AST 32  ALT 47*  ALKPHOS 68  BILITOT 1.1  PROT 7.3  ALBUMIN 3.2*   No results for input(s): "LIPASE", "AMYLASE" in the last 168 hours. No results for input(s): "AMMONIA" in the last 168 hours. CBC: Recent Labs  Lab 09/18/21 1517 09/19/21 0509  WBC 12.4* 12.2*  NEUTROABS 8.9*  --   HGB 13.9 11.9*  HCT 42.9 37.1*  MCV 93.3 91.6  PLT 377 339   Cardiac Enzymes: No results for input(s): "CKTOTAL", "CKMB", "CKMBINDEX", "TROPONINI" in the last 168 hours. BNP: Invalid input(s): "POCBNP" CBG: No results for input(s): "GLUCAP" in the last 168 hours. D-Dimer No results for input(s): "DDIMER" in the last 72 hours. Hgb A1c No results for input(s): "HGBA1C" in the last 72 hours. Lipid Profile No results for input(s): "CHOL", "HDL", "LDLCALC", "TRIG", "CHOLHDL", "LDLDIRECT" in the last 72 hours. Thyroid function studies No results for input(s): "TSH", "T4TOTAL", "T3FREE", "THYROIDAB" in the last 72 hours.  Invalid input(s): "FREET3" Anemia work up No results for input(s): "VITAMINB12", "FOLATE", "FERRITIN", "TIBC", "IRON", "RETICCTPCT" in the last 72 hours. Urinalysis No results found for: "COLORURINE", "APPEARANCEUR", "LABSPEC", "PHURINE", "GLUCOSEU", "HGBUR", "BILIRUBINUR", "KETONESUR", "PROTEINUR", "UROBILINOGEN", "NITRITE", "LEUKOCYTESUR" Sepsis Labs Recent Labs  Lab 09/18/21 1517 09/19/21 0509  WBC 12.4* 12.2*   Microbiology Recent Results (from the past 240 hour(s))  SARS Coronavirus 2 by  RT PCR (hospital order, performed in [facility] hospital lab) *cepheid single result test* Anterior Nasal Swab     Status: None   Collection Time: 09/18/21  4:12 PM   Specimen: Anterior Nasal Swab  Result Value Ref Range Status   SARS Coronavirus 2 by RT PCR NEGATIVE NEGATIVE Final    Comment: (NOTE) SARS-CoV-2 target nucleic acids are NOT DETECTED.  The SARS-CoV-2 RNA is generally detectable in upper and lower respiratory specimens during the acute phase of infection. The lowest concentration of SARS-CoV-2 viral copies this assay can detect is 250 copies / mL. A negative result does not preclude SARS-CoV-2 infection and should not be used as the sole basis for treatment or other patient management decisions.  A negative result may occur with improper specimen collection / handling, submission of specimen other than nasopharyngeal swab, presence of viral mutation(s) within the areas targeted by this assay, and inadequate number of viral copies (<250 copies / mL). A negative result must be combined with clinical observations, patient history, and epidemiological information.  Fact Sheet for Patients:   RoadLapTop.co.za  Fact Sheet for Healthcare Providers: http://kim-miller.com/  This test is not yet approved or  cleared by the [place] FDA and has been authorized for detection and/or diagnosis of SARS-CoV-2 by FDA under an Emergency Use Authorization (EUA).  This EUA will remain in effect (meaning this test can be used) for the duration of the COVID-19 declaration under Section 564(b)(1) of the Act, 21 U.S.C. section 360bbb-3(b)(1), unless the authorization is terminated or revoked sooner.  Performed at [facility], [address] [place], [place] [address]      Time coordinating discharge: Over 30 minutes  SIGNED:   Tresa Moore, MD  [facility] 09/19/2021, 12:20 PM Pager   If 7PM-7AM, please  contact night-coverage

## 2021-09-19 NOTE — Consult Note (Addendum)
[facility]       Patient ID: Kevin BergerDaniel L Acosta MRN: [id number] DOB/AGE: 10/22/1945 76 y.o.  Admit date: 09/18/2021 Referring Physician Dr. Roxan Hockeyobinson  Primary Physician Dr. Maryjane HurterFeldpausch Primary Cardiologist Dr. Darrold JunkerParaschos Reason for Consultation Atrial flutter RVR   HPI: Kevin DapperDaniel Acosta is a [age]yoM with a PMH of hypertension, hyperlipidemia, pulmonary embolus (2004), GI bleed (2020), severe IV contrast dye allergy, who was [facility] (discharged 09/08/2021) and treated for pericarditis [facility] on 09/18/2021 from Dr. Darrold JunkerParaschos office with tachycardia.  He was found to be in atrial flutter with RVR with heart rate greater than 160.  Cardiology is consulted for further assistance.  The patient presents with his wife.  He went for a hospital follow-up visit with Dr. Darrold JunkerParaschos on 8/10 and was found to be significantly tachycardic to 160s-170s in atrial flutter by EKG.  His wife notes the patient felt good for about a week after discharge from his last admission, although he remains with a dry cough (possibly attributed to benazepril) and he started having more pleuritic chest discomfort 4 days ago and felt rather fatigued.  He has been compliant with his colchicine and other medicines since discharge.  He otherwise denies any dizziness, orthopnea, lower extremity edema.  In the ED he was given metoprolol tartrate IV 5 mg x 2 doses, diltiazem 15 mg IV x 1 dose, and eventually started on a diltiazem drip in addition to p.o. Cardizem CD 240 mg.  His heart rate this morning was slightly better controlled in the 110s, remaining in atrial flutter.  Vitals are notable for recent blood pressure of 100/64, saturating well on room air.  Labs are notable for potassium 3.5, BUN/creatinine 19/0.9 GFR greater than 60.  Magnesium 2.2.  Slight leukocytosis with WBCs stable around 12, hemoglobin with slight decrease overnight from 13.9-11.9, platelets 339.  COVID  PCR negative chest x-ray with small right pleural effusion possible trace left.  Review of systems complete and found to be negative unless listed above     Past Medical History:  Diagnosis Date   Hyperlipidemia    Hypertension    PE (pulmonary thromboembolism) (HCC)     Past Surgical History:  Procedure Laterality Date   NO PAST SURGERIES     VISCERAL ANGIOGRAPHY N/A 07/02/2018   Procedure: VISCERAL ANGIOGRAPHY with possible embolization;  Surgeon: Annice Needyew, Jason S, MD;  Location: [facility] INVASIVE CV LAB;  Service: Cardiovascular;  Laterality: N/A;    Medications Prior to Admission  Medication Sig Dispense Refill Last Dose   ascorbic acid (VITAMIN C) 500 MG tablet Take 500 mg by mouth daily.   Past Week   Cholecalciferol (VITAMIN D3) 25 MCG (1000 UT) CAPS Take 1,000 Units by mouth daily.   Past Week   colchicine 0.6 MG tablet Take 1 tablet (0.6 mg total) by mouth 2 (two) times daily. 60 tablet 0 09/18/2021 at 0800   losartan (COZAAR) 25 MG tablet Take 25 mg by mouth daily.   09/18/2021 at 0800   Multiple Vitamins-Minerals (CENTRUM SILVER 50+MEN) TABS Take 1 tablet by mouth daily.   Past Week   Omega-3 Fatty Acids (FISH OIL OMEGA-3) 1000 MG CAPS Take 1 capsule by mouth daily.   Past Week   Zinc 50 MG TABS Take 50 mg by mouth daily.   Past Week   benazepril (LOTENSIN) 5 MG tablet Take 5 mg by mouth daily. (Patient not taking: Reported on 09/18/2021)   Not Taking  benzonatate (TESSALON) 200 MG capsule Take 1 capsule by mouth 3 (three) times daily as needed. (Patient not taking: Reported on 09/18/2021)   Not Taking   Social History   Socioeconomic History   Marital status: Married    Spouse name: Not on file   Number of children: Not on file   Years of education: Not on file   Highest education level: Not on file  Occupational History   Not on file  Tobacco Use   Smoking status: Never   Smokeless tobacco: Never  Vaping Use   Vaping Use: Never used  Substance and Sexual Activity    Alcohol use: Yes    Comment: social   Drug use: No   Sexual activity: Not on file  Other Topics Concern   Not on file  Social History Narrative   Not on file   Social Determinants of Health   Financial Resource Strain: Not on file  Food Insecurity: Not on file  Transportation Needs: Not on file  Physical Activity: Not on file  Stress: Not on file  Social Connections: Not on file  Intimate Partner Violence: Not on file    Family History  Problem Relation Age of Onset   Diabetes Mother    Lung cancer Father       PHYSICAL EXAM General: Pleasant elderly Caucasian male, well nourished, in no acute distress.  Laying in incline in PCU bed with wife at bedside HEENT:  Normocephalic and atraumatic. Neck:  No JVD.  Lungs: Normal respiratory effort on room air. Clear bilaterally to auscultation. No wheezes, crackles, rhonchi.  Heart: Tachycardic irregular irregular rhythm. Normal S1 and S2 without gallops or murmurs. Radial & DP pulses 2+ bilaterally. Abdomen: Non-distended appearing.  Msk: Normal strength and tone for age. Extremities: Warm and well perfused. No clubbing, cyanosis.  No peripheral edema.  Neuro: Alert and oriented X 3. Psych:  Answers questions appropriately.   Labs:   Lab Results  Component Value Date   WBC 12.2 (H) 09/19/2021   HGB 11.9 (L) 09/19/2021   HCT 37.1 (L) 09/19/2021   MCV 91.6 09/19/2021   PLT 339 09/19/2021    Recent Labs  Lab 09/18/21 1517 09/19/21 0509  NA 138 139  K 3.4* 3.5  CL 101 110  CO2 24 24  BUN 20 19  CREATININE 1.11 0.90  CALCIUM 8.7* 7.9*  PROT 7.3  --   BILITOT 1.1  --   ALKPHOS 68  --   ALT 47*  --   AST 32  --   GLUCOSE 157* 123*   No results found for: "CKTOTAL", "CKMB", "CKMBINDEX", "TROPONINI"  Lab Results  Component Value Date   CHOL 140 09/08/2021   CHOL 174 07/18/2015   CHOL 217 (H) 01/14/2015   Lab Results  Component Value Date   HDL 55 09/08/2021   HDL 51 07/18/2015   HDL 41 01/14/2015   Lab  Results  Component Value Date   LDLCALC 79 09/08/2021   LDLCALC 107 (H) 07/18/2015   LDLCALC 148 (H) 01/14/2015   Lab Results  Component Value Date   TRIG 32 09/08/2021   TRIG 80 07/18/2015   TRIG 140 01/14/2015   Lab Results  Component Value Date   CHOLHDL 2.5 09/08/2021   CHOLHDL 3.4 07/18/2015   CHOLHDL 5.3 (H) 01/14/2015   No results found for: "LDLDIRECT"    Radiology: DG Chest Portable 1 View  Result Date: 09/18/2021 CLINICAL DATA:  Tachycardia EXAM: PORTABLE CHEST 1 VIEW COMPARISON:  09/07/2021 FINDINGS: The heart size and mediastinal contours are within normal limits. Patchy right lower lobe airspace opacity with small right pleural effusion. Possible trace left pleural effusion. No pneumothorax. The visualized skeletal structures are unremarkable. IMPRESSION: Patchy right lower lobe airspace opacity with small right pleural effusion. Possible trace left pleural effusion. Electronically Signed   By: Duanne Guess D.O.   On: 09/18/2021 15:50   ECHOCARDIOGRAM COMPLETE  Result Date: 09/08/2021    ECHOCARDIOGRAM REPORT   Patient Name:   MASAYOSHI COUZENS Date of Exam: 09/08/2021 Medical Rec #:  [id number]        Height:       70.0 in Accession #:    [id number]       Weight:       160.0 lb Date of Birth:  [birth year]        BSA:          1.898 m Patient Age:    76 years         BP:           122/80 mmHg Patient Gender: M                HR:           95 bpm. Exam Location:  [facility] Procedure: 2D Echo, Cardiac Doppler and Color Doppler Indications:     Chest pain R07.9  History:         Patient has no prior history of Echocardiogram examinations.                  Risk Factors:Dyslipidemia and Hypertension. PE.  Sonographer:     Cristela Blue Referring Phys:  [id number] Brien Few NIU Diagnosing Phys: Arnoldo Hooker MD  Sonographer Comments: Image quality was fair. IMPRESSIONS  1. Left ventricular ejection fraction, by estimation, is 65 to 70%. The left ventricle has normal function. The left ventricle has  no regional wall motion abnormalities. Left ventricular diastolic parameters were normal.  2. Right ventricular systolic function is normal. The right ventricular size is normal.  3. The mitral valve is normal in structure. Trivial mitral valve regurgitation.  4. The aortic valve is normal in structure. Aortic valve regurgitation is not visualized. FINDINGS  Left Ventricle: Left ventricular ejection fraction, by estimation, is 65 to 70%. The left ventricle has normal function. The left ventricle has no regional wall motion abnormalities. The left ventricular internal cavity size was normal in size. There is  no left ventricular hypertrophy. Left ventricular diastolic parameters were normal. Right Ventricle: The right ventricular size is normal. No increase in right ventricular wall thickness. Right ventricular systolic function is normal. Left Atrium: Left atrial size was normal in size. Right Atrium: Right atrial size was normal in size. Pericardium: There is no evidence of pericardial effusion. Mitral Valve: The mitral valve is normal in structure. Trivial mitral valve regurgitation. Tricuspid Valve: The tricuspid valve is normal in structure. Tricuspid valve regurgitation is trivial. Aortic Valve: The aortic valve is normal in structure. Aortic valve regurgitation is not visualized. Aortic valve mean gradient measures 3.5 mmHg. Aortic valve peak gradient measures 6.1 mmHg. Aortic valve area, by VTI measures 3.41 cm. Pulmonic Valve: The pulmonic valve was normal in structure. Pulmonic valve regurgitation is not visualized. Aorta: The aortic root and ascending aorta are structurally normal, with no evidence of dilitation. IAS/Shunts: No atrial level shunt detected by color flow Doppler.  LEFT VENTRICLE PLAX 2D LVIDd:         4.00 cm  Diastology LVIDs:         2.40 cm   LV e' medial:    7.18 cm/s LV PW:         1.30 cm   LV E/e' medial:  10.6 LV IVS:        1.00 cm   LV e' lateral:   6.20 cm/s LVOT diam:     2.00  cm   LV E/e' lateral: 12.2 LV SV:         67 LV SV Index:   35 LVOT Area:     3.14 cm  RIGHT VENTRICLE RV Basal diam:  3.10 cm RV S prime:     16.40 cm/s TAPSE (M-mode): 2.3 cm LEFT ATRIUM             Index        RIGHT ATRIUM           Index LA diam:        2.60 cm 1.37 cm/m   RA Area:     12.70 cm LA Vol (A2C):   47.3 ml 24.91 ml/m  RA Volume:   30.60 ml  16.12 ml/m LA Vol (A4C):   46.5 ml 24.49 ml/m LA Biplane Vol: 47.8 ml 25.18 ml/m  AORTIC VALVE AV Area (Vmax):    3.04 cm AV Area (Vmean):   2.93 cm AV Area (VTI):     3.41 cm AV Vmax:           123.00 cm/s AV Vmean:          87.550 cm/s AV VTI:            0.196 m AV Peak Grad:      6.1 mmHg AV Mean Grad:      3.5 mmHg LVOT Vmax:         119.00 cm/s LVOT Vmean:        81.700 cm/s LVOT VTI:          0.213 m LVOT/AV VTI ratio: 1.09  AORTA Ao Root diam: 3.35 cm MITRAL VALVE               TRICUSPID VALVE MV Area (PHT): 3.58 cm    TR Peak grad:   14.0 mmHg MV Decel Time: 212 msec    TR Vmax:        187.00 cm/s MV E velocity: 75.80 cm/s MV A velocity: 99.80 cm/s  SHUNTS MV E/A ratio:  0.76        Systemic VTI:  0.21 m                            Systemic Diam: 2.00 cm Arnoldo Hooker MD Electronically signed by Arnoldo Hooker MD Signature Date/Time: 09/08/2021/12:00:53 PM    Final    US Venous Img Lower Bilateral (DVT)  Result Date: 09/07/2021 CLINICAL DATA:  Positive D-dimer EXAM: BILATERAL LOWER EXTREMITY VENOUS DOPPLER ULTRASOUND TECHNIQUE: Gray-scale sonography with compression, as well as color and duplex ultrasound, were performed to evaluate the deep venous system(s) from the level of the common femoral vein through the popliteal and proximal calf veins. COMPARISON:  None Available. FINDINGS: VENOUS Normal compressibility of the common femoral, superficial femoral, and popliteal veins, as well as the visualized calf veins. Visualized portions of profunda femoral vein and great saphenous vein unremarkable. No filling defects to suggest DVT on  grayscale or color Doppler imaging. Doppler waveforms show normal direction of venous flow, normal respiratory plasticity  and response to augmentation. Limited views of the contralateral common femoral vein are unremarkable. OTHER None. Limitations: none IMPRESSION: Negative. Electronically Signed   By: Gerome Sam III M.D.   On: 09/07/2021 12:59   NM Pulmonary Perfusion  Result Date: 09/07/2021 CLINICAL DATA:  Pulmonary embolism (PE) suspected, high prob EXAM: NUCLEAR MEDICINE PERFUSION LUNG SCAN TECHNIQUE: Perfusion images were obtained in multiple projections after intravenous injection of radiopharmaceutical. Ventilation scans intentionally deferred if perfusion scan and chest x-ray adequate for interpretation during COVID 19 epidemic. RADIOPHARMACEUTICALS:  4.32 mCi Tc-57m MAA IV COMPARISON:  Same day chest x-ray FINDINGS: Physiologic distribution of radiotracer throughout both lungs. No segmental or larger perfusion defect. IMPRESSION: Low probability for pulmonary embolism. Electronically Signed   By: Duanne Guess D.O.   On: 09/07/2021 11:27   DG Chest Portable 1 View  Result Date: 09/07/2021 CLINICAL DATA:  76 year old male with chest pain radiating to the back. Shortness of breath. EXAM: PORTABLE CHEST 1 VIEW COMPARISON:  Chest radiographs 09/04/2021 and earlier. FINDINGS: Portable AP upright view at 0805 hours. Lower lung volumes with mild crowding of markings. Mediastinal contours remain within normal limits. Visualized tracheal air column is within normal limits. No pneumothorax, pleural effusion or confluent pulmonary opacity. No pulmonary edema suspected. Stable visualized osseous structures. Negative visible bowel gas. IMPRESSION: Lower lung volumes.  No acute cardiopulmonary abnormality. Electronically Signed   By: Odessa Fleming M.D.   On: 09/07/2021 08:19   DG Chest 2 View  Result Date: 09/04/2021 CLINICAL DATA:  Fall pneumonia EXAM: CHEST - 2 VIEW COMPARISON:  05/17/2021 FINDINGS: The  heart size and mediastinal contours are within normal limits. Interval resolution of previously seen right perihilar airspace opacity. Both lungs are clear. The visualized skeletal structures are unremarkable. IMPRESSION: No active cardiopulmonary disease. Interval resolution of previously seen right perihilar airspace opacity. Electronically Signed   By: Duanne Guess D.O.   On: 09/04/2021 15:56    ECHO LVEF 65 to 70%, 09/08/2021  TELEMETRY reviewed by me: Atrial flutter with variable conduction, rate in the 110s  EKG reviewed by me: atrial flutter 4:1 conduction on 8/11, atrial flutter variable block rate 167  ASSESSMENT AND PLAN:  Latavius Capizzi is a 89yoM with a PMH of hypertension, hyperlipidemia, pulmonary embolus (2004), GI bleed (2020), severe IV contrast dye allergy, who was recently admitted at [facility] for 2 days (discharged 09/08/2021) and treated for pericarditis presents to [facility] ED on 09/18/2021 from Dr. Darrold Junker office with tachycardia.  He was found to be in atrial flutter with RVR with heart rate greater than 160.  Cardiology is consulted for further assistance.  # New onset atrial flutter with RVR # History of GI bleed (2020) The patient presented from his regular cardiologist office which was supposed to be a hospital follow-up from his recent admission for pericarditis (discharged on 09/08/2021).  He was noted to be extremely tachycardic with heart rate greater than 160, otherwise relatively well-appearing and hemodynamically stable.  He was sent to the ED and was started on multiple rate controlling medications that have taken effect overnight, with heart rate better controlled in the 110s this morning. -S/p IV diltiazem 15 mg x 1, IV metoprolol 5 mg x 2, diltiazem gtt. -Continue Cardizem CD2 40 mg once daily -Continue metoprolol tartrate 25 mg twice daily -Start Eliquis 5 mg twice daily for stroke risk reduction, continue PPI for GI protection with history of GI bleed.   CHA2DS2-VASc 3 -If the patient is able to ambulate and his heart rate  stays in the high 90s to low 100s later today, he is okay for discharge from a cardiac standpoint today, if not tomorrow morning. -He will need follow-up with Dr. Darrold Junker in 1 to 2 weeks, for consideration of DCCV if he remains in atrial flutter after 3 weeks of interrupted anticoagulation.  # History of pericarditis Was admitted for 2 days, discharged 09/08/2021 with diagnostic EKG for pericarditis.  Still admits to some pleuritic chest discomfort, but overall improved compared to that admission. -Continue colchicine 0.6 mg twice daily, PPI   This patient's plan of care was discussed and created with Dr. Gwen Pounds and he is in agreement.  Signed: Rebeca Allegra , PA-C 09/19/2021, 9:14 AM [facility]  The patient has tolerated early amounts of medication management for atrial flutter with rapid ventricular rate.  It does appear that the patient may have this due to current inflammatory issues.  There is no evidence of acute coronary syndrome or congestive heart failure.  We will defer any electrical cardioversion due to the patient being able to tolerate current heart rate with no evidence of significant issues and will adjust medication management as an outpatient.  Patient will need anticoagulation for further risk reduction in stroke with atrial flutter until that period of time  I have personally reviewed his last hospitalization records as well as chest x-ray EKG telemetry and discussed at length the possibility of electrical cardioversion and its risks and benefits if necessary  The patient has been interviewed and examined. I agree with assessment and plan above. Arnoldo Hooker MD [facility]

## 2021-09-20 ENCOUNTER — Other Ambulatory Visit: Payer: Self-pay

## 2021-09-20 ENCOUNTER — Inpatient Hospital Stay
Admission: EM | Admit: 2021-09-20 | Discharge: 2021-09-22 | DRG: 310 | Disposition: A | Discharge status: Home / Self Care | Payer: Medicare Other | Attending: Internal Medicine | Admitting: Internal Medicine

## 2021-09-20 ENCOUNTER — Emergency Department: Payer: Medicare Other

## 2021-09-20 ENCOUNTER — Encounter: Payer: Self-pay | Admitting: Intensive Care

## 2021-09-20 DIAGNOSIS — R7989 Other specified abnormal findings of blood chemistry: Secondary | ICD-10-CM

## 2021-09-20 DIAGNOSIS — J9811 Atelectasis: Secondary | ICD-10-CM | POA: Diagnosis not present

## 2021-09-20 DIAGNOSIS — Z833 Family history of diabetes mellitus: Secondary | ICD-10-CM

## 2021-09-20 DIAGNOSIS — I4891 Unspecified atrial fibrillation: Secondary | ICD-10-CM

## 2021-09-20 DIAGNOSIS — D72829 Elevated white blood cell count, unspecified: Secondary | ICD-10-CM | POA: Diagnosis not present

## 2021-09-20 DIAGNOSIS — Z91041 Radiographic dye allergy status: Secondary | ICD-10-CM | POA: Diagnosis not present

## 2021-09-20 DIAGNOSIS — Z86711 Personal history of pulmonary embolism: Secondary | ICD-10-CM

## 2021-09-20 DIAGNOSIS — I10 Essential (primary) hypertension: Secondary | ICD-10-CM | POA: Diagnosis not present

## 2021-09-20 DIAGNOSIS — Z7901 Long term (current) use of anticoagulants: Secondary | ICD-10-CM

## 2021-09-20 DIAGNOSIS — R778 Other specified abnormalities of plasma proteins: Secondary | ICD-10-CM | POA: Diagnosis not present

## 2021-09-20 DIAGNOSIS — Z20822 Contact with and (suspected) exposure to covid-19: Secondary | ICD-10-CM | POA: Diagnosis not present

## 2021-09-20 DIAGNOSIS — K219 Gastro-esophageal reflux disease without esophagitis: Secondary | ICD-10-CM

## 2021-09-20 DIAGNOSIS — E785 Hyperlipidemia, unspecified: Secondary | ICD-10-CM | POA: Diagnosis not present

## 2021-09-20 DIAGNOSIS — Z79899 Other long term (current) drug therapy: Secondary | ICD-10-CM | POA: Diagnosis not present

## 2021-09-20 DIAGNOSIS — I4892 Unspecified atrial flutter: Secondary | ICD-10-CM | POA: Diagnosis not present

## 2021-09-20 DIAGNOSIS — Z801 Family history of malignant neoplasm of trachea, bronchus and lung: Secondary | ICD-10-CM | POA: Diagnosis not present

## 2021-09-20 DIAGNOSIS — M1000 Idiopathic gout, unspecified site: Secondary | ICD-10-CM | POA: Diagnosis not present

## 2021-09-20 DIAGNOSIS — Z66 Do not resuscitate: Secondary | ICD-10-CM | POA: Diagnosis present

## 2021-09-20 DIAGNOSIS — J90 Pleural effusion, not elsewhere classified: Secondary | ICD-10-CM | POA: Diagnosis not present

## 2021-09-20 DIAGNOSIS — R000 Tachycardia, unspecified: Secondary | ICD-10-CM | POA: Diagnosis not present

## 2021-09-20 DIAGNOSIS — M109 Gout, unspecified: Secondary | ICD-10-CM | POA: Diagnosis not present

## 2021-09-20 HISTORY — DX: Unspecified atrial flutter: I48.92

## 2021-09-20 LAB — TROPONIN I (HIGH SENSITIVITY)
Troponin I (High Sensitivity): 33 ng/L — ABNORMAL HIGH (ref <18)
Troponin I (High Sensitivity): 41 ng/L — ABNORMAL HIGH (ref <18)
Troponin I (High Sensitivity): 38 ng/L — ABNORMAL HIGH (ref <18)

## 2021-09-20 LAB — CBC
HCT: 45.6 % (ref 39.0–52.0)
Hemoglobin: 14.4 g/dL (ref 13.0–17.0)
MCH: 29.7 pg (ref 26.0–34.0)
MCHC: 31.6 g/dL (ref 30.0–36.0)
MCV: 94 fL (ref 80.0–100.0)
Platelets: 397 10*3/uL (ref 150–400)
RBC: 4.85 MIL/uL (ref 4.22–5.81)
RDW: 13 % (ref 11.5–15.5)
WBC: 10.7 10*3/uL — ABNORMAL HIGH (ref 4.0–10.5)
nRBC: 0 % (ref 0.0–0.2)

## 2021-09-20 LAB — BASIC METABOLIC PANEL
Anion gap: 9 (ref 5–15)
BUN: 14 mg/dL (ref 8–23)
CO2: 21 mmol/L — ABNORMAL LOW (ref 22–32)
Calcium: 8.6 mg/dL — ABNORMAL LOW (ref 8.9–10.3)
Chloride: 108 mmol/L (ref 98–111)
Creatinine, Ser: 0.86 mg/dL (ref 0.61–1.24)
GFR, Estimated: >60 mL/min (ref >60)
Glucose, Bld: 115 mg/dL — ABNORMAL HIGH (ref 70–99)
Potassium: 3.6 mmol/L (ref 3.5–5.1)
Sodium: 138 mmol/L (ref 135–145)

## 2021-09-20 LAB — MAGNESIUM: Magnesium: 2 mg/dL (ref 1.7–2.4)

## 2021-09-20 LAB — PROTIME-INR
INR: 1.4 — ABNORMAL HIGH (ref 0.8–1.2)
Prothrombin Time: 17 seconds — ABNORMAL HIGH (ref 11.4–15.2)

## 2021-09-20 MED ORDER — APIXABAN 5 MG PO TABS
5.0000 mg | ORAL_TABLET | Freq: Two times a day (BID) | ORAL | Status: DC
  Administered 2021-09-20 – 2021-09-22 (×4): 5 mg via ORAL
  Filled 2021-09-20 (×4): qty 1

## 2021-09-20 MED ORDER — SODIUM CHLORIDE 0.9 % IV BOLUS
500.0000 mL | Freq: Once | INTRAVENOUS | Status: AC
  Administered 2021-09-20: 500 mL via INTRAVENOUS

## 2021-09-20 MED ORDER — ACETAMINOPHEN 650 MG RE SUPP: 650.0000 mg | Freq: Four times a day (QID) | RECTAL | Status: DC | PRN

## 2021-09-20 MED ORDER — POTASSIUM CHLORIDE 20 MEQ PO PACK
40.0000 meq | PACK | Freq: Once | ORAL | Status: AC
  Administered 2021-09-20: 40 meq via ORAL
  Filled 2021-09-20: qty 2

## 2021-09-20 MED ORDER — ACETAMINOPHEN 325 MG PO TABS: 650.0000 mg | ORAL_TABLET | Freq: Four times a day (QID) | ORAL | Status: DC | PRN

## 2021-09-20 MED ORDER — ADULT MULTIVITAMIN W/MINERALS CH
1.0000 | ORAL_TABLET | Freq: Every day | ORAL | Status: DC
  Filled 2021-09-20 (×2): qty 1

## 2021-09-20 MED ORDER — METOPROLOL TARTRATE 25 MG PO TABS
25.0000 mg | ORAL_TABLET | Freq: Two times a day (BID) | ORAL | Status: DC
  Administered 2021-09-20 – 2021-09-22 (×4): 25 mg via ORAL
  Filled 2021-09-20 (×4): qty 1

## 2021-09-20 MED ORDER — PANTOPRAZOLE SODIUM 40 MG PO TBEC
40.0000 mg | DELAYED_RELEASE_TABLET | Freq: Every day | ORAL | Status: DC
  Administered 2021-09-21 – 2021-09-22 (×2): 40 mg via ORAL
  Filled 2021-09-20 (×2): qty 1

## 2021-09-20 MED ORDER — ONDANSETRON HCL 4 MG PO TABS: 4.0000 mg | ORAL_TABLET | Freq: Four times a day (QID) | ORAL | Status: DC | PRN

## 2021-09-20 MED ORDER — ORAL CARE MOUTH RINSE: 15.0000 mL | OROMUCOSAL | Status: DC | PRN

## 2021-09-20 MED ORDER — ONDANSETRON HCL 4 MG/2ML IJ SOLN: 4.0000 mg | Freq: Four times a day (QID) | INTRAMUSCULAR | Status: DC | PRN

## 2021-09-20 MED ORDER — VITAMIN D3 25 MCG (1000 UNIT) PO TABS
1000.0000 [IU] | ORAL_TABLET | Freq: Every day | ORAL | Status: DC
  Filled 2021-09-20 (×4): qty 1

## 2021-09-20 MED ORDER — DILTIAZEM HCL-DEXTROSE 125-5 MG/125ML-% IV SOLN (PREMIX)
5.0000 mg/h | INTRAVENOUS | Status: DC
  Administered 2021-09-20: 5 mg/h via INTRAVENOUS
  Administered 2021-09-21: 12.5 mg/h via INTRAVENOUS
  Filled 2021-09-20 (×3): qty 125

## 2021-09-20 MED ORDER — TRAZODONE HCL 50 MG PO TABS
25.0000 mg | ORAL_TABLET | Freq: Every evening | ORAL | Status: DC | PRN
  Administered 2021-09-20: 25 mg via ORAL
  Filled 2021-09-20: qty 1

## 2021-09-20 MED ORDER — POTASSIUM CHLORIDE IN NACL 20-0.9 MEQ/L-% IV SOLN
INTRAVENOUS | Status: DC
  Filled 2021-09-20 (×3): qty 1000

## 2021-09-20 MED ORDER — OMEGA-3-ACID ETHYL ESTERS 1 G PO CAPS
1.0000 | ORAL_CAPSULE | Freq: Every day | ORAL | Status: DC
  Administered 2021-09-21: 1 g via ORAL
  Filled 2021-09-20 (×2): qty 1

## 2021-09-20 MED ORDER — ZINC SULFATE 220 (50 ZN) MG PO CAPS
220.0000 mg | ORAL_CAPSULE | Freq: Every day | ORAL | Status: DC
  Administered 2021-09-20: 220 mg via ORAL
  Filled 2021-09-20 (×3): qty 1

## 2021-09-20 MED ORDER — ASCORBIC ACID 500 MG PO TABS
500.0000 mg | ORAL_TABLET | Freq: Every day | ORAL | Status: DC
  Filled 2021-09-20 (×2): qty 1

## 2021-09-20 MED ORDER — COLCHICINE 0.6 MG PO TABS: 0.6000 mg | ORAL_TABLET | Freq: Two times a day (BID) | ORAL | Status: DC

## 2021-09-20 MED ORDER — MAGNESIUM HYDROXIDE 400 MG/5ML PO SUSP: 30.0000 mL | Freq: Every day | ORAL | Status: DC | PRN

## 2021-09-20 NOTE — Assessment & Plan Note (Signed)
-   We will continue colchicine. 

## 2021-09-20 NOTE — H&P (Addendum)
[place]   PATIENT NAME: Kevin Acosta    MR#:  [id number]  DATE OF BIRTH:  10-07-1945  DATE OF ADMISSION:  09/20/2021  PRIMARY CARE PHYSICIAN: Marina Goodell, MD   Patient is coming from: Home  REQUESTING/REFERRING PHYSICIAN: Minna Antis, MD  CHIEF COMPLAINT:   Chief Complaint  Patient presents with   Tachycardia    HISTORY OF PRESENT ILLNESS:  Kevin Acosta is a 76 y.o. Caucasian male with medical history significant for paroxysmal atrial flutter, hypertension, dyslipidemia and PE on Eliquis, who presented to the ER with acute onset of fatigue that started this morning with tachycardia without associated palpitations.  The patient was just discharged a couple days ago after being managed for atrial flutter with rapid ventricular response, which has been responding to diltiazem.  He was discharged on p.o. diltiazem and metoprolol.  Yesterday his heart rate was in the 90s in the morning and during the day.  When he woke up this morning and was having fatigue and palpitations his heart rate was in the 160s.  He continued to feel tired throughout the day.  He admits to dyspnea without cough or wheezing.  No chest pain.  No nausea or vomiting or abdominal pain.  No bleeding diathesis.  No fever or chills.  No dysuria, oliguria or hematuria or flank pain.  No worsening leg pain or edema or recent travels.  The patient was just discharged from here on 8/10 after being managed for atrial flutter with RVR, responding to IV Cardizem and p.o. metoprolol with slowing of his rhythm without conversion.  He was discharged on Cardizem CD 140 mg p.o. daily and metoprolol 25 mg p.o. twice daily in addition to Eliquis 5 mg p.o. twice daily with prophylactic Protonix.  ED Course: Upon presentation to the emergency room, BP 145/113 heart rate of 163 with otherwise normal vital signs.  He was in atrial flutter with RVR.  BMP was unremarkable and CBC showed WBC of 10.7.  INR was 1.4  with PT 17. EKG as reviewed by me : EKG showed atrial flutter with 2-1 AV conduction with a rate of 165 with poor R wave progression. Imaging: Two-view chest x-ray showed left basal atelectasis.  Patient was given IV diltiazem drip and 500 mL IV normal saline bolus.  He will be admitted to a PCU bed for further evaluation and management. PAST MEDICAL HISTORY:   Past Medical History:  Diagnosis Date   Atrial flutter (HCC)    Hyperlipidemia    Hypertension    PE (pulmonary thromboembolism) (HCC)     PAST SURGICAL HISTORY:   Past Surgical History:  Procedure Laterality Date   NO PAST SURGERIES     VISCERAL ANGIOGRAPHY N/A 07/02/2018   Procedure: VISCERAL ANGIOGRAPHY with possible embolization;  Surgeon: Annice Needy, MD;  Location: [facility] INVASIVE CV LAB;  Service: Cardiovascular;  Laterality: N/A;    SOCIAL HISTORY:   Social History   Tobacco Use   Smoking status: Never   Smokeless tobacco: Never  Substance Use Topics   Alcohol use: Yes    Comment: social    FAMILY HISTORY:   Family History  Problem Relation Age of Onset   Diabetes Mother    Lung cancer Father     DRUG ALLERGIES:   Allergies  Allergen Reactions   Contrast Media [Iodinated Contrast Media] Other (See Comments)    Hypotension, Vagal, "coded"    REVIEW OF SYSTEMS:   ROS As per history  of present illness. All pertinent systems were reviewed above. Constitutional, HEENT, cardiovascular, respiratory, GI, GU, musculoskeletal, neuro, psychiatric, endocrine, integumentary and hematologic systems were reviewed and are otherwise negative/unremarkable except for positive findings mentioned above in the HPI.   MEDICATIONS AT HOME:   Prior to Admission medications   Medication Sig Start Date End Date Taking? Authorizing Provider  apixaban (ELIQUIS) 5 MG TABS tablet Take 1 tablet (5 mg total) by mouth 2 (two) times daily. 09/19/21 11/18/21 Yes Sreenath, Sudheer B, MD  ascorbic acid (VITAMIN C) 500 MG tablet  Take 500 mg by mouth daily.   Yes [provider]  Cholecalciferol (VITAMIN D3) 25 MCG (1000 UT) CAPS Take 1,000 Units by mouth daily.   Yes [provider]  colchicine 0.6 MG tablet Take 1 tablet (0.6 mg total) by mouth 2 (two) times daily. 09/08/21 10/08/21 Yes Charise Killian, MD  diltiazem (CARDIZEM CD) 240 MG 24 hr capsule Take 1 capsule (240 mg total) by mouth daily. 09/20/21 11/19/21 Yes Sreenath, Sudheer B, MD  metoprolol tartrate (LOPRESSOR) 25 MG tablet Take 1 tablet (25 mg total) by mouth 2 (two) times daily. 09/19/21 11/18/21 Yes Sreenath, Sudheer B, MD  Multiple Vitamins-Minerals (CENTRUM SILVER 50+MEN) TABS Take 1 tablet by mouth daily.   Yes [provider]  Omega-3 Fatty Acids (FISH OIL OMEGA-3) 1000 MG CAPS Take 1 capsule by mouth daily.   Yes [provider]  pantoprazole (PROTONIX) 40 MG tablet Take 1 tablet (40 mg total) by mouth daily. 09/20/21 11/19/21 Yes Sreenath, Sudheer B, MD  Zinc 50 MG TABS Take 50 mg by mouth daily.   Yes [provider]      VITAL SIGNS:  Blood pressure (!) 107/93, pulse (!) 107, temperature 98.3 F (36.8 C), temperature source Oral, resp. rate 18, height 5\' 10"  (1.778 m), weight 74.8 kg, SpO2 95 %.  PHYSICAL EXAMINATION:  Physical Exam  GENERAL:  76 y.o.-year-old Caucasian male patient lying in the bed with no acute distress.  EYES: Pupils equal, round, reactive to light and accommodation. No scleral icterus. Extraocular muscles intact.  HEENT: Head atraumatic, normocephalic. Oropharynx and nasopharynx clear.  NECK:  Supple, no jugular venous distention. No thyroid enlargement, no tenderness.  LUNGS: Normal breath sounds bilaterally, no wheezing, rales,rhonchi or crepitation. No use of accessory muscles of respiration.  CARDIOVASCULAR: Tachycardic regular rhythm, S1, S2 normal. No murmurs, rubs, or gallops.  ABDOMEN: Soft, nondistended, nontender. Bowel sounds present. No organomegaly or mass.   EXTREMITIES: No pedal edema, cyanosis, or clubbing.  NEUROLOGIC: Cranial nerves II through XII are intact. Muscle strength 5/5 in all extremities. Sensation intact. Gait not checked.  PSYCHIATRIC: The patient is alert and oriented x 3.  Normal affect and good eye contact. SKIN: No obvious rash, lesion, or ulcer.   LABORATORY PANEL:   CBC Recent Labs  Lab 09/20/21 1446  WBC 10.7*  HGB 14.4  HCT 45.6  PLT 397   ------------------------------------------------------------------------------------------------------------------  Chemistries  Recent Labs  Lab 09/18/21 1517 09/18/21 1606 09/19/21 0509 09/20/21 1446  NA 138  --    < > 138  K 3.4*  --    < > 3.6  CL 101  --    < > 108  CO2 24  --    < > 21*  GLUCOSE 157*  --    < > 115*  BUN 20  --    < > 14  CREATININE 1.11  --    < > 0.86  CALCIUM 8.7*  --    < >  8.6*  MG  --  2.2  --   --   AST 32  --   --   --   ALT 47*  --   --   --   ALKPHOS 68  --   --   --   BILITOT 1.1  --   --   --    < > = values in this interval not displayed.   ------------------------------------------------------------------------------------------------------------------  Cardiac Enzymes No results for input(s): "TROPONINI" in the last 168 hours. ------------------------------------------------------------------------------------------------------------------  RADIOLOGY:  DG Chest 2 View  Result Date: 09/20/2021 CLINICAL DATA:  Tachycardia. EXAM: CHEST - 2 VIEW COMPARISON:  09/18/2021 FINDINGS: Heart size remains normal. Moderate left pleural effusion and small right pleural effusion show increase in size since previous study. Increased atelectasis also noted in left lung base. IMPRESSION: Increased size of bilateral pleural effusions, left side greater than right. Increased left basilar atelectasis. Electronically Signed   By: Danae Orleans M.D.   On: 09/20/2021 16:06      IMPRESSION AND PLAN:  Assessment and Plan: * Atrial flutter with  rapid ventricular response (HCC) - The will be admitted to a PCU bed. -He will be continued on IV Cardizem drip. - We will optimize her his potassium and check magnesium level. - We will follow serial troponins. - We will continue beta-blocker therapy and Eliquis. - Cardiology consult will be obtained. - Dr. Gwen Pounds was notified about the patient and is aware.  Elevated troponin -This like secondary to his atrial flutter with RVR. - He has no current chest pain. - We will follow serial troponins.  GERD without esophagitis - We will continue PPI therapy.  Gout - We will continue colchicine.   DVT prophylaxis: Lovenox.  Advanced Care Planning:  Code Status: DNR/DNI.  This was discussed with him. Family Communication:  The plan of care was discussed in details with the patient (and family). I answered all questions. The patient agreed to proceed with the above mentioned plan. Further management will depend upon hospital course. Disposition Plan: Back to previous home environment Consults called:.  Cardiology All the records are reviewed and case discussed with ED provider.  Status is: Inpatient  At the time of the admission, it appears that the appropriate admission status for this patient is inpatient.  This is judged to be reasonable and necessary in order to provide the required intensity of service to ensure the patient's safety given the presenting symptoms, physical exam findings and initial radiographic and laboratory data in the context of comorbid conditions.  The patient requires inpatient status due to high intensity of service, high risk of further deterioration and high frequency of surveillance required.  I certify that at the time of admission, it is my clinical judgment that the patient will require inpatient hospital care extending more than 2 midnights.                            Dispo: The patient is from: Home              Anticipated d/c is to: Home               Patient currently is not medically stable to d/c.              Difficult to place patient: No  Hannah Beat M.D on 09/20/2021 at 10:12 PM  [facility]   From 7 PM-7 AM, contact night-coverage [email or web address]  CC: Primary care physician; Marina Goodell, MD

## 2021-09-20 NOTE — ED Triage Notes (Addendum)
Patient reports recently being admitted for tachycardia. When released they told patient to check his HR every morning. When he checked his HR today it was in the 160s. Patient took medication and waited hours and then came to ER when HR was still reading high in 160s.   Patient denies feeling heart racing or pain  Patients only complaint is "feeling tired" and his ankles are swollen today

## 2021-09-20 NOTE — ED Provider Notes (Signed)
[facility] Provider Note    Event Date/Time   First MD Initiated Contact with Patient 09/20/21 1556     (approximate)  History   Chief Complaint: Tachycardia  HPI  Kevin Acosta is a 76 y.o. male with a past medical history of hypertension, hyperlipidemia, paroxysmal atrial flutter, prior PE on Eliquis who presents to the emergency department for tachycardia.  Patient was recently discharged from the hospital, according to discharge summary yesterday patient was recently admitted to the hospital with atrial flutter with rapid ventricular response.  Seemed to respond well to diltiazem.  Was discharged on diltiazem as well as metoprolol.  Patient states yesterday he was taking his pulse rate is in the 90s, this morning he checked his pulse rate and it was 160 patient states he has been feeling very fatigued today.  Wife states mild shortness of breath.  Patient denies any chest pain.  Physical Exam   Triage Vital Signs: ED Triage Vitals  Enc Vitals Group     BP 09/20/21 1450 (!) 145/103     Pulse Rate 09/20/21 1450 (!) 163     Resp 09/20/21 1450 20     Temp 09/20/21 1450 98.2 F (36.8 C)     Temp Source 09/20/21 1450 Oral     SpO2 09/20/21 1450 97 %     Weight 09/20/21 1444 165 lb (74.8 kg)     Height 09/20/21 1444 5\' 10"  (1.778 m)     Head Circumference --      Peak Flow --      Pain Score 09/20/21 1444 0     Pain Loc --      Pain Edu? --      Excl. in GC? --     Most recent vital signs: Vitals:   09/20/21 1450 09/20/21 1600  BP: (!) 145/103 (!) 142/108  Pulse: (!) 163 (!) 167  Resp: 20 20  Temp: 98.2 F (36.8 C)   SpO2: 97% 99%    General: Awake, no distress.  CV:  Good peripheral perfusion.  Regular rhythm rate around 160 bpm. Resp:  Normal effort.  Equal breath sounds bilaterally.  Abd:  No distention.  Soft, nontender.  No rebound or guarding.  ED Results / Procedures / Treatments   EKG  EKG viewed and interpreted by myself  shows atrial flutter with a 2-1 AV block 165 bpm.  Narrow QRS, normal axis, normal intervals, nonspecific ST changes.  RADIOLOGY  I viewed and interpreted the chest x-ray images patient appears to have a moderate size left pleural effusion but no other focal consolidation seen on my evaluation. Radiology has read increased bilateral pleural effusions left greater than right.   MEDICATIONS ORDERED IN ED: Medications  diltiazem (CARDIZEM) 125 mg in dextrose 5% 125 mL (1 mg/mL) infusion (5 mg/hr Intravenous New Bag/Given 09/20/21 1705)  sodium chloride 0.9 % bolus 500 mL (500 mLs Intravenous New Bag/Given 09/20/21 1703)     IMPRESSION / MDM / ASSESSMENT AND PLAN / ED COURSE  I reviewed the triage vital signs and the nursing notes.  Patient's presentation is most consistent with acute presentation with potential threat to life or bodily function.  Patient presents to the emergency department for tachycardia around 165 bpm.  Patient just discharged from the hospital yesterday after being admitted for atrial flutter with rapid ventricular response.  Patient appears to be in atrial flutter with rapid ventricular response once again.  We will start the patient on diltiazem infusion  which appeared to work well for the patient last time.  We will check labs and continue to closely monitor.  Patient agreeable to plan of care.  Patient's lab work has resulted showing a reassuring chemistry, overall reassuring CBC with a slight troponin elevation at 33 very likely demand ischemia.  We will repeat a troponin after 2 hours.  We will plan to likely admit to the hospital for further work-up and cardiology evaluation.  Patient's troponin has increased slightly to 41.  Remains in a flutter with RVR around 160 despite increasing diltiazem drip dose.  We will admit to the hospital service for ongoing work-up and management.  FINAL CLINICAL IMPRESSION(S) / ED DIAGNOSES   Atrial flutter with 2-1 AV block Atrial  flutter with rapid ventricular response   Note:  This document was prepared using Dragon voice recognition software and may include unintentional dictation errors.   Minna Antis, MD 09/20/21 1907

## 2021-09-20 NOTE — Assessment & Plan Note (Signed)
-   We will continue PPI therapy 

## 2021-09-20 NOTE — Assessment & Plan Note (Addendum)
Spontaneously converted back to sinus rhythm. -Try weaning him off from Cardizem -Cardiology started him on metoprolol -Continue with Eliquis

## 2021-09-20 NOTE — Assessment & Plan Note (Signed)
-  This like secondary to his atrial flutter with RVR. - He has no current chest pain. - We will follow serial troponins.

## 2021-09-21 DIAGNOSIS — D72829 Elevated white blood cell count, unspecified: Secondary | ICD-10-CM | POA: Diagnosis not present

## 2021-09-21 DIAGNOSIS — I4892 Unspecified atrial flutter: Secondary | ICD-10-CM | POA: Diagnosis not present

## 2021-09-21 DIAGNOSIS — I4891 Unspecified atrial fibrillation: Secondary | ICD-10-CM | POA: Diagnosis not present

## 2021-09-21 DIAGNOSIS — Z20822 Contact with and (suspected) exposure to covid-19: Secondary | ICD-10-CM | POA: Diagnosis not present

## 2021-09-21 DIAGNOSIS — K219 Gastro-esophageal reflux disease without esophagitis: Secondary | ICD-10-CM | POA: Diagnosis not present

## 2021-09-21 DIAGNOSIS — Z7901 Long term (current) use of anticoagulants: Secondary | ICD-10-CM | POA: Diagnosis not present

## 2021-09-21 DIAGNOSIS — Z66 Do not resuscitate: Secondary | ICD-10-CM | POA: Diagnosis not present

## 2021-09-21 DIAGNOSIS — I10 Essential (primary) hypertension: Secondary | ICD-10-CM | POA: Diagnosis not present

## 2021-09-21 DIAGNOSIS — Z86711 Personal history of pulmonary embolism: Secondary | ICD-10-CM | POA: Diagnosis not present

## 2021-09-21 DIAGNOSIS — E785 Hyperlipidemia, unspecified: Secondary | ICD-10-CM | POA: Diagnosis not present

## 2021-09-21 LAB — TROPONIN I (HIGH SENSITIVITY): Troponin I (High Sensitivity): 40 ng/L — ABNORMAL HIGH (ref <18)

## 2021-09-21 LAB — CBC
HCT: 37.3 % — ABNORMAL LOW (ref 39.0–52.0)
Hemoglobin: 12.2 g/dL — ABNORMAL LOW (ref 13.0–17.0)
MCH: 30 pg (ref 26.0–34.0)
MCHC: 32.7 g/dL (ref 30.0–36.0)
MCV: 91.6 fL (ref 80.0–100.0)
Platelets: 386 10*3/uL (ref 150–400)
RBC: 4.07 MIL/uL — ABNORMAL LOW (ref 4.22–5.81)
RDW: 12.9 % (ref 11.5–15.5)
WBC: 10 10*3/uL (ref 4.0–10.5)
nRBC: 0 % (ref 0.0–0.2)

## 2021-09-21 LAB — BASIC METABOLIC PANEL
Anion gap: 4 — ABNORMAL LOW (ref 5–15)
BUN: 11 mg/dL (ref 8–23)
CO2: 25 mmol/L (ref 22–32)
Calcium: 8 mg/dL — ABNORMAL LOW (ref 8.9–10.3)
Chloride: 111 mmol/L (ref 98–111)
Creatinine, Ser: 0.86 mg/dL (ref 0.61–1.24)
GFR, Estimated: >60 mL/min (ref >60)
Glucose, Bld: 127 mg/dL — ABNORMAL HIGH (ref 70–99)
Potassium: 3.6 mmol/L (ref 3.5–5.1)
Sodium: 140 mmol/L (ref 135–145)

## 2021-09-21 MED ORDER — GUAIFENESIN-DM 100-10 MG/5ML PO SYRP
5.0000 mL | ORAL_SOLUTION | ORAL | Status: DC | PRN
Start: 2021-09-21 — End: 2021-09-22
  Administered 2021-09-21: 5 mL via ORAL
  Filled 2021-09-21: qty 10

## 2021-09-21 MED ORDER — COLCHICINE 0.6 MG PO TABS
0.6000 mg | ORAL_TABLET | Freq: Every day | ORAL | Status: DC
  Administered 2021-09-21 – 2021-09-22 (×2): 0.6 mg via ORAL
  Filled 2021-09-21 (×2): qty 1

## 2021-09-21 NOTE — Hospital Course (Addendum)
Taken from H&P.  Kevin Acosta is a 76 y.o. Caucasian male with medical history significant for paroxysmal atrial flutter, hypertension, dyslipidemia and PE on Eliquis, who presented to the ER with acute onset of fatigue that started this morning with tachycardia without associated palpitations.  The patient was just discharged a couple days ago after being managed for atrial flutter with rapid ventricular response, which has been responding to diltiazem.  He was discharged on p.o. diltiazem and metoprolol.  Yesterday his heart rate was in the 90s in the morning and during the day.  When he woke up this morning and was having fatigue and palpitations his heart rate was in the 160s.  He continued to feel tired throughout the day.  He admits to dyspnea without cough or wheezing.  No chest pain.  No nausea or vomiting or abdominal pain.  No bleeding diathesis.  No fever or chills.  No dysuria, oliguria or hematuria or flank pain.  No worsening leg pain or edema or recent travels.   The patient was just discharged from here on 8/10 after being managed for atrial flutter with RVR, responding to IV Cardizem and p.o. metoprolol with slowing of his rhythm without conversion.  He was discharged on Cardizem CD 140 mg p.o. daily and metoprolol 25 mg p.o. twice daily in addition to Eliquis 5 mg p.o. twice daily with prophylactic Protonix.   ED Course: Upon presentation to the emergency room, BP 145/113 heart rate of 163 with otherwise normal vital signs.  He was in atrial flutter with RVR.  BMP was unremarkable and CBC showed WBC of 10.7.  INR was 1.4 with PT 17. EKG as reviewed by me : EKG showed atrial flutter with 2-1 AV conduction with a rate of 165 with poor R wave progression. Imaging: Two-view chest x-ray showed left basal atelectasis.   Patient was given IV diltiazem drip and 500 mL IV normal saline bolus.  He will be admitted to a PCU bed for further evaluation and management.  8/13: Patient  spontaneously converted back to sinus rhythm.  Trying to wean him off from diltiazem infusion.  Metoprolol 25 mg twice daily was added by cardiology.  8/14: Patient remained stable and in sinus rhythm.  He was weaned off from diltiazem infusion.  He is being discharged on metoprolol 25 mg twice daily,  and added low-dose losartan for better control of blood pressure. He will continue taking Eliquis and amiodarone which will be tapered off as an outpatient.  Patient will continue on current medications and need to have a close follow-up with his providers for further recommendations.

## 2021-09-21 NOTE — Consult Note (Signed)
[facility] Cardiology Consultation Note  Patient ID: Kevin Acosta, MRN: [id number], DOB/AGE: [birth year] 76 y.o. Admit date: 09/20/2021   Date of Consult: 09/21/2021 Primary Physician: Marina Goodell, MD    Chief Complaint:  Chief Complaint  Patient presents with   Tachycardia   Reason for Consult:  Atrial flutter  HPI: 76 y.o. male with known recent concerns for pericarditis hypertension and hyperlipidemia for which she has been receiving appropriate treatment.  He was recently seen in the emergency room with acute onset of atrial flutter with rapid ventricular rate 260 bpm.  Due to some inflammatory issues the patient was not cardioverted to normal rhythm.  In light of this the patient was to improve with his inflammatory issues with heart rate control until possible spontaneous conversion to normal sinus rhythm.  He was placed on Eliquis without any bleeding complications and he is metoprolol and diltiazem combination.  The patient had an issue with rapid ventricular rate even despite medication management throughout the morning with shortness of breath and was seen in the emergency room at that time the patient was started back on the diltiazem drip in addition to the other medication management.  Since then he has been still slightly short of breath but did spontaneously convert to normal sinus rhythm at this time and is feeling better.  Currently telemetry shows normal sinus rhythm.  EKG also shows normal sinus rhythm with no evidence of other EKG changes.  Chest x-ray shows small pleural effusions and some atelectasis although will reevaluate at a later time  Past Medical History:  Diagnosis Date   Atrial flutter (HCC)    Hyperlipidemia    Hypertension    PE (pulmonary thromboembolism) (HCC)       Surgical History:  Past Surgical History:  Procedure Laterality Date   NO PAST SURGERIES     VISCERAL ANGIOGRAPHY N/A 07/02/2018   Procedure: VISCERAL ANGIOGRAPHY with possible  embolization;  Surgeon: Annice Needy, MD;  Location: [facility] INVASIVE CV LAB;  Service: Cardiovascular;  Laterality: N/A;     Home Meds: Prior to Admission medications   Medication Sig Start Date End Date Taking? Authorizing Provider  apixaban (ELIQUIS) 5 MG TABS tablet Take 1 tablet (5 mg total) by mouth 2 (two) times daily. 09/19/21 11/18/21 Yes Sreenath, Sudheer B, MD  ascorbic acid (VITAMIN C) 500 MG tablet Take 500 mg by mouth daily.   Yes [provider]  Cholecalciferol (VITAMIN D3) 25 MCG (1000 UT) CAPS Take 1,000 Units by mouth daily.   Yes [provider]  colchicine 0.6 MG tablet Take 1 tablet (0.6 mg total) by mouth 2 (two) times daily. 09/08/21 10/08/21 Yes Charise Killian, MD  diltiazem (CARDIZEM CD) 240 MG 24 hr capsule Take 1 capsule (240 mg total) by mouth daily. 09/20/21 11/19/21 Yes Sreenath, Sudheer B, MD  metoprolol tartrate (LOPRESSOR) 25 MG tablet Take 1 tablet (25 mg total) by mouth 2 (two) times daily. 09/19/21 11/18/21 Yes Sreenath, Sudheer B, MD  Multiple Vitamins-Minerals (CENTRUM SILVER 50+MEN) TABS Take 1 tablet by mouth daily.   Yes [provider]  Omega-3 Fatty Acids (FISH OIL OMEGA-3) 1000 MG CAPS Take 1 capsule by mouth daily.   Yes [provider]  pantoprazole (PROTONIX) 40 MG tablet Take 1 tablet (40 mg total) by mouth daily. 09/20/21 11/19/21 Yes Sreenath, Sudheer B, MD  Zinc 50 MG TABS Take 50 mg by mouth daily.   Yes [provider]    Inpatient Medications:  apixaban  5 mg Oral BID   ascorbic acid  500 mg Oral Daily   cholecalciferol  1,000 Units Oral Daily   colchicine  0.6 mg Oral Daily   metoprolol tartrate  25 mg Oral BID   multivitamin with minerals  1 tablet Oral Daily   omega-3 acid ethyl esters  1 capsule Oral Daily   pantoprazole  40 mg Oral Daily   zinc sulfate  220 mg Oral Daily    0.9 % NaCl with KCl 20 mEq / L 100 mL/hr at 09/21/21 0910    Allergies:  Allergies  Allergen Reactions    Contrast Media [Iodinated Contrast Media] Other (See Comments)    Hypotension, Vagal, "coded"    Social History   Socioeconomic History   Marital status: Married    Spouse name: Not on file   Number of children: Not on file   Years of education: Not on file   Highest education level: Not on file  Occupational History   Not on file  Tobacco Use   Smoking status: Never   Smokeless tobacco: Never  Vaping Use   Vaping Use: Never used  Substance and Sexual Activity   Alcohol use: Yes    Comment: social   Drug use: No   Sexual activity: Not on file  Other Topics Concern   Not on file  Social History Narrative   Not on file   Social Determinants of Health   Financial Resource Strain: Not on file  Food Insecurity: Not on file  Transportation Needs: Not on file  Physical Activity: Not on file  Stress: Not on file  Social Connections: Not on file  Intimate Partner Violence: Not on file     Family History  Problem Relation Age of Onset   Diabetes Mother    Lung cancer Father      Review of Systems Positive for shortness of breath Negative for: General:  chills, fever, night sweats or weight changes.  Cardiovascular: PND orthopnea syncope dizziness  Dermatological skin lesions rashes Respiratory: Cough congestion Urologic: Frequent urination urination at night and hematuria Abdominal: negative for nausea, vomiting, diarrhea, bright red blood per rectum, melena, or hematemesis Neurologic: negative for visual changes, and/or hearing changes  All other systems reviewed and are otherwise negative except as noted above.  Labs: No results for input(s): "CKTOTAL", "CKMB", "TROPONINI" in the last 72 hours. Lab Results  Component Value Date   WBC 10.0 09/21/2021   HGB 12.2 (L) 09/21/2021   HCT 37.3 (L) 09/21/2021   MCV 91.6 09/21/2021   PLT 386 09/21/2021    Recent Labs  Lab 09/18/21 1517 09/19/21 0509 09/21/21 0430  NA 138   < > 140  K 3.4*   < > 3.6  CL 101   <  > 111  CO2 24   < > 25  BUN 20   < > 11  CREATININE 1.11   < > 0.86  CALCIUM 8.7*   < > 8.0*  PROT 7.3  --   --   BILITOT 1.1  --   --   ALKPHOS 68  --   --   ALT 47*  --   --   AST 32  --   --   GLUCOSE 157*   < > 127*   < > = values in this interval not displayed.   Lab Results  Component Value Date   CHOL 140 09/08/2021   HDL 55 09/08/2021   LDLCALC 79 09/08/2021  TRIG 32 09/08/2021   Lab Results  Component Value Date   DDIMER 0.98 (H) 09/07/2021    Radiology/Studies:  DG Chest 2 View  Result Date: 09/20/2021 CLINICAL DATA:  Tachycardia. EXAM: CHEST - 2 VIEW COMPARISON:  09/18/2021 FINDINGS: Heart size remains normal. Moderate left pleural effusion and small right pleural effusion show increase in size since previous study. Increased atelectasis also noted in left lung base. IMPRESSION: Increased size of bilateral pleural effusions, left side greater than right. Increased left basilar atelectasis. Electronically Signed   By: Danae Orleans M.D.   On: 09/20/2021 16:06   DG Chest Portable 1 View  Result Date: 09/18/2021 CLINICAL DATA:  Tachycardia EXAM: PORTABLE CHEST 1 VIEW COMPARISON:  09/07/2021 FINDINGS: The heart size and mediastinal contours are within normal limits. Patchy right lower lobe airspace opacity with small right pleural effusion. Possible trace left pleural effusion. No pneumothorax. The visualized skeletal structures are unremarkable. IMPRESSION: Patchy right lower lobe airspace opacity with small right pleural effusion. Possible trace left pleural effusion. Electronically Signed   By: Duanne Guess D.O.   On: 09/18/2021 15:50   ECHOCARDIOGRAM COMPLETE  Result Date: 09/08/2021    ECHOCARDIOGRAM REPORT   Patient Name:   Kevin Acosta Date of Exam: 09/08/2021 Medical Rec #:  [id number]        Height:       70.0 in Accession #:    [id number]       Weight:       160.0 lb Date of Birth:  1945/09/12        BSA:          1.898 m Patient Age:    76 years         BP:            122/80 mmHg Patient Gender: M                HR:           95 bpm. Exam Location:  [facility] Procedure: 2D Echo, Cardiac Doppler and Color Doppler Indications:     Chest pain R07.9  History:         Patient has no prior history of Echocardiogram examinations.                  Risk Factors:Dyslipidemia and Hypertension. PE.  Sonographer:     Cristela Blue Referring Phys:  [id number] Brien Few NIU Diagnosing Phys: Arnoldo Hooker MD  Sonographer Comments: Image quality was fair. IMPRESSIONS  1. Left ventricular ejection fraction, by estimation, is 65 to 70%. The left ventricle has normal function. The left ventricle has no regional wall motion abnormalities. Left ventricular diastolic parameters were normal.  2. Right ventricular systolic function is normal. The right ventricular size is normal.  3. The mitral valve is normal in structure. Trivial mitral valve regurgitation.  4. The aortic valve is normal in structure. Aortic valve regurgitation is not visualized. FINDINGS  Left Ventricle: Left ventricular ejection fraction, by estimation, is 65 to 70%. The left ventricle has normal function. The left ventricle has no regional wall motion abnormalities. The left ventricular internal cavity size was normal in size. There is  no left ventricular hypertrophy. Left ventricular diastolic parameters were normal. Right Ventricle: The right ventricular size is normal. No increase in right ventricular wall thickness. Right ventricular systolic function is normal. Left Atrium: Left atrial size was normal in size. Right Atrium: Right atrial size was normal in size. Pericardium: There is no evidence  of pericardial effusion. Mitral Valve: The mitral valve is normal in structure. Trivial mitral valve regurgitation. Tricuspid Valve: The tricuspid valve is normal in structure. Tricuspid valve regurgitation is trivial. Aortic Valve: The aortic valve is normal in structure. Aortic valve regurgitation is not visualized. Aortic valve mean  gradient measures 3.5 mmHg. Aortic valve peak gradient measures 6.1 mmHg. Aortic valve area, by VTI measures 3.41 cm. Pulmonic Valve: The pulmonic valve was normal in structure. Pulmonic valve regurgitation is not visualized. Aorta: The aortic root and ascending aorta are structurally normal, with no evidence of dilitation. IAS/Shunts: No atrial level shunt detected by color flow Doppler.  LEFT VENTRICLE PLAX 2D LVIDd:         4.00 cm   Diastology LVIDs:         2.40 cm   LV e' medial:    7.18 cm/s LV PW:         1.30 cm   LV E/e' medial:  10.6 LV IVS:        1.00 cm   LV e' lateral:   6.20 cm/s LVOT diam:     2.00 cm   LV E/e' lateral: 12.2 LV SV:         67 LV SV Index:   35 LVOT Area:     3.14 cm  RIGHT VENTRICLE RV Basal diam:  3.10 cm RV S prime:     16.40 cm/s TAPSE (M-mode): 2.3 cm LEFT ATRIUM             Index        RIGHT ATRIUM           Index LA diam:        2.60 cm 1.37 cm/m   RA Area:     12.70 cm LA Vol (A2C):   47.3 ml 24.91 ml/m  RA Volume:   30.60 ml  16.12 ml/m LA Vol (A4C):   46.5 ml 24.49 ml/m LA Biplane Vol: 47.8 ml 25.18 ml/m  AORTIC VALVE AV Area (Vmax):    3.04 cm AV Area (Vmean):   2.93 cm AV Area (VTI):     3.41 cm AV Vmax:           123.00 cm/s AV Vmean:          87.550 cm/s AV VTI:            0.196 m AV Peak Grad:      6.1 mmHg AV Mean Grad:      3.5 mmHg LVOT Vmax:         119.00 cm/s LVOT Vmean:        81.700 cm/s LVOT VTI:          0.213 m LVOT/AV VTI ratio: 1.09  AORTA Ao Root diam: 3.35 cm MITRAL VALVE               TRICUSPID VALVE MV Area (PHT): 3.58 cm    TR Peak grad:   14.0 mmHg MV Decel Time: 212 msec    TR Vmax:        187.00 cm/s MV E velocity: 75.80 cm/s MV A velocity: 99.80 cm/s  SHUNTS MV E/A ratio:  0.76        Systemic VTI:  0.21 m                            Systemic Diam: 2.00 cm Arnoldo HookerBruce Jacobie Stamey MD Electronically signed by Arnoldo HookerBruce Garris Melhorn MD Signature Date/Time:  09/08/2021/12:00:53 PM    Final    US Venous Img Lower Bilateral (DVT)  Result Date:  09/07/2021 CLINICAL DATA:  Positive D-dimer EXAM: BILATERAL LOWER EXTREMITY VENOUS DOPPLER ULTRASOUND TECHNIQUE: Gray-scale sonography with compression, as well as color and duplex ultrasound, were performed to evaluate the deep venous system(s) from the level of the common femoral vein through the popliteal and proximal calf veins. COMPARISON:  None Available. FINDINGS: VENOUS Normal compressibility of the common femoral, superficial femoral, and popliteal veins, as well as the visualized calf veins. Visualized portions of profunda femoral vein and great saphenous vein unremarkable. No filling defects to suggest DVT on grayscale or color Doppler imaging. Doppler waveforms show normal direction of venous flow, normal respiratory plasticity and response to augmentation. Limited views of the contralateral common femoral vein are unremarkable. OTHER None. Limitations: none IMPRESSION: Negative. Electronically Signed   By: Gerome Sam III M.D.   On: 09/07/2021 12:59   NM Pulmonary Perfusion  Result Date: 09/07/2021 CLINICAL DATA:  Pulmonary embolism (PE) suspected, high prob EXAM: NUCLEAR MEDICINE PERFUSION LUNG SCAN TECHNIQUE: Perfusion images were obtained in multiple projections after intravenous injection of radiopharmaceutical. Ventilation scans intentionally deferred if perfusion scan and chest x-ray adequate for interpretation during COVID 19 epidemic. RADIOPHARMACEUTICALS:  4.32 mCi Tc-75m MAA IV COMPARISON:  Same day chest x-ray FINDINGS: Physiologic distribution of radiotracer throughout both lungs. No segmental or larger perfusion defect. IMPRESSION: Low probability for pulmonary embolism. Electronically Signed   By: Duanne Guess D.O.   On: 09/07/2021 11:27   DG Chest Portable 1 View  Result Date: 09/07/2021 CLINICAL DATA:  76 year old male with chest pain radiating to the back. Shortness of breath. EXAM: PORTABLE CHEST 1 VIEW COMPARISON:  Chest radiographs 09/04/2021 and earlier. FINDINGS:  Portable AP upright view at 0805 hours. Lower lung volumes with mild crowding of markings. Mediastinal contours remain within normal limits. Visualized tracheal air column is within normal limits. No pneumothorax, pleural effusion or confluent pulmonary opacity. No pulmonary edema suspected. Stable visualized osseous structures. Negative visible bowel gas. IMPRESSION: Lower lung volumes.  No acute cardiopulmonary abnormality. Electronically Signed   By: Odessa Fleming M.D.   On: 09/07/2021 08:19   DG Chest 2 View  Result Date: 09/04/2021 CLINICAL DATA:  Fall pneumonia EXAM: CHEST - 2 VIEW COMPARISON:  05/17/2021 FINDINGS: The heart size and mediastinal contours are within normal limits. Interval resolution of previously seen right perihilar airspace opacity. Both lungs are clear. The visualized skeletal structures are unremarkable. IMPRESSION: No active cardiopulmonary disease. Interval resolution of previously seen right perihilar airspace opacity. Electronically Signed   By: Duanne Guess D.O.   On: 09/04/2021 15:56    EKG: Normal sinus rhythm otherwise normal EKG  Weights: Filed Weights   09/20/21 1444  Weight: 74.8 kg     Physical Exam: Blood pressure 136/80, pulse 81, temperature 98.6 F (37 C), temperature source Oral, resp. rate 16, height  (1.778 m), weight 74.8 kg, SpO2 95 %. Body mass index is 23.68 kg/m. General: Well developed, well nourished, in no acute distress. Head eyes ears nose throat: Normocephalic, atraumatic, sclera non-icteric, no xanthomas, nares are without discharge. No apparent thyromegaly and/or mass  Lungs: Normal respiratory effort.  Few to wheezes, no rales, no rhonchi.  Heart: RRR with normal S1 S2. no murmur gallop, no rub, PMI is normal size and placement, carotid upstroke normal without bruit, jugular venous pressure is normal Abdomen: Soft, non-tender, non-distended with normoactive bowel sounds. No hepatomegaly. No rebound/guarding. No obvious abdominal  masses. Abdominal aorta is normal size without bruit Extremities: No edema. no cyanosis, no clubbing, no ulcers  Peripheral : 2+ bilateral upper extremity pulses, 2+ bilateral femoral pulses, 2+ bilateral dorsal pedal pulse Neuro: Alert and oriented. No facial asymmetry. No focal deficit. Moves all extremities spontaneously. Musculoskeletal: Normal muscle tone without kyphosis Psych:  Responds to questions appropriately with a normal affect.    Assessment: 76 year old male with known hypertension having inflammatory abnormalities including pericarditis and other issues which is likely caused atrial flutter with rapid ventricular rate now spontaneous converted normal sinus rhythm on appropriate medication management.  There is no current evidence of acute coronary syndrome and/or congestive heart failure  Plan: 1.  Continuation of anticoagulation for 3 weeks after now spontaneous conversion to normal sinus rhythm to lower the risk for stroke with atrial fibrillation 2.  Continuation of metoprolol at 25 mg twice per day 3.  Further consideration of discontinuation of diltiazem drip now converted into normal rhythm 4.  Continue current medication management including colchicine and other anti-inflammatories for pericarditis 5.  No further diagnostic testing and needed 6.  If ambulating well the patient may be able discharged home in the morning  Signed, Lamar Blinks M.D. [facility] 09/21/2021, 2:09 PM

## 2021-09-21 NOTE — Progress Notes (Signed)
Progress Note   Patient: Kevin Acosta BMW:[id number] DOB: [birth year] DOA: 09/20/2021     1 DOS: the patient was seen and examined on 09/21/2021   Brief hospital course: Taken from H&P.  Kevin Acosta is a 76 y.o. Caucasian male with medical history significant for paroxysmal atrial flutter, hypertension, dyslipidemia and PE on Eliquis, who presented to the ER with acute onset of fatigue that started this morning with tachycardia without associated palpitations.  The patient was just discharged a couple days ago after being managed for atrial flutter with rapid ventricular response, which has been responding to diltiazem.  He was discharged on p.o. diltiazem and metoprolol.  Yesterday his heart rate was in the 90s in the morning and during the day.  When he woke up this morning and was having fatigue and palpitations his heart rate was in the 160s.  He continued to feel tired throughout the day.  He admits to dyspnea without cough or wheezing.  No chest pain.  No nausea or vomiting or abdominal pain.  No bleeding diathesis.  No fever or chills.  No dysuria, oliguria or hematuria or flank pain.  No worsening leg pain or edema or recent travels.   The patient was just discharged from here on 8/10 after being managed for atrial flutter with RVR, responding to IV Cardizem and p.o. metoprolol with slowing of his rhythm without conversion.  He was discharged on Cardizem CD 140 mg p.o. daily and metoprolol 25 mg p.o. twice daily in addition to Eliquis 5 mg p.o. twice daily with prophylactic Protonix.   ED Course: Upon presentation to the emergency room, BP 145/113 heart rate of 163 with otherwise normal vital signs.  He was in atrial flutter with RVR.  BMP was unremarkable and CBC showed WBC of 10.7.  INR was 1.4 with PT 17. EKG as reviewed by me : EKG showed atrial flutter with 2-1 AV conduction with a rate of 165 with poor R wave progression. Imaging: Two-view chest x-ray showed left basal  atelectasis.   Patient was given IV diltiazem drip and 500 mL IV normal saline bolus.  He will be admitted to a PCU bed for further evaluation and management.  8/13: Patient spontaneously converted back to sinus rhythm.  Trying to wean him off from diltiazem infusion.  Metoprolol 25 mg twice daily was added by cardiology. If remains stable-can go back home tomorrow morning   Assessment and Plan: * Atrial flutter with rapid ventricular response (HCC) Spontaneously converted back to sinus rhythm. -Try weaning him off from Cardizem -Cardiology started him on metoprolol -Continue with Eliquis  Elevated troponin -This like secondary to his atrial flutter with RVR. - He has no current chest pain. - Serial troponin with a flat curve.  GERD without esophagitis - We will continue PPI therapy.  Gout - We will continue colchicine.   Subjective: Patient was seen and examined today.  No complaints.  Feeling much improved.  Physical Exam: Vitals:   09/20/21 2330 09/21/21 0418 09/21/21 0816 09/21/21 1257  BP: 127/78 112/78 127/83 136/80  Pulse: 81 72 80 81  Resp: 20 18 16 16   Temp: 97.7 F (36.5 C) 98.9 F (37.2 C) 98 F (36.7 C) 98.6 F (37 C)  TempSrc:   Oral Oral  SpO2: 91% 94% 91% 95%  Weight:      Height:       General.  Well-developed elderly man, in no acute distress. Pulmonary.  Lungs clear bilaterally, normal respiratory effort. CV.  Regular rate and rhythm, no JVD, rub or murmur. Abdomen.  Soft, nontender, nondistended, BS positive. CNS.  Alert and oriented .  No focal neurologic deficit. Extremities.  No edema, no cyanosis, pulses intact and symmetrical. Psychiatry.  Judgment and insight appears normal.  Data Reviewed: Prior data reviewed  Family Communication: Discussed with wife and daughter at bedside  Disposition: Status is: Inpatient Remains inpatient appropriate because: Severity of illness   Planned Discharge Destination: Home  DVT prophylaxis.   Eliquis Time spent: 42 minutes  This record has been created using Conservation officer, historic buildings. Errors have been sought and corrected,but may not always be located. Such creation errors do not reflect on the standard of care.  Author: Arnetha Courser, MD 09/21/2021 3:27 PM  For on call review [email or web address].

## 2021-09-22 DIAGNOSIS — D72829 Elevated white blood cell count, unspecified: Secondary | ICD-10-CM | POA: Diagnosis not present

## 2021-09-22 DIAGNOSIS — Z66 Do not resuscitate: Secondary | ICD-10-CM | POA: Diagnosis not present

## 2021-09-22 DIAGNOSIS — Z7901 Long term (current) use of anticoagulants: Secondary | ICD-10-CM | POA: Diagnosis not present

## 2021-09-22 DIAGNOSIS — I4892 Unspecified atrial flutter: Secondary | ICD-10-CM | POA: Diagnosis not present

## 2021-09-22 DIAGNOSIS — Z20822 Contact with and (suspected) exposure to covid-19: Secondary | ICD-10-CM | POA: Diagnosis not present

## 2021-09-22 DIAGNOSIS — K219 Gastro-esophageal reflux disease without esophagitis: Secondary | ICD-10-CM | POA: Diagnosis not present

## 2021-09-22 DIAGNOSIS — I10 Essential (primary) hypertension: Secondary | ICD-10-CM | POA: Diagnosis not present

## 2021-09-22 DIAGNOSIS — E785 Hyperlipidemia, unspecified: Secondary | ICD-10-CM | POA: Diagnosis not present

## 2021-09-22 DIAGNOSIS — I4891 Unspecified atrial fibrillation: Secondary | ICD-10-CM | POA: Diagnosis not present

## 2021-09-22 DIAGNOSIS — Z86711 Personal history of pulmonary embolism: Secondary | ICD-10-CM | POA: Diagnosis not present

## 2021-09-22 LAB — BASIC METABOLIC PANEL
Anion gap: 5 (ref 5–15)
BUN: 11 mg/dL (ref 8–23)
CO2: 23 mmol/L (ref 22–32)
Calcium: 8.3 mg/dL — ABNORMAL LOW (ref 8.9–10.3)
Chloride: 112 mmol/L — ABNORMAL HIGH (ref 98–111)
Creatinine, Ser: 0.93 mg/dL (ref 0.61–1.24)
GFR, Estimated: >60 mL/min (ref >60)
Glucose, Bld: 102 mg/dL — ABNORMAL HIGH (ref 70–99)
Potassium: 3.9 mmol/L (ref 3.5–5.1)
Sodium: 140 mmol/L (ref 135–145)

## 2021-09-22 MED ORDER — LOSARTAN POTASSIUM 25 MG PO TABS: 25.0000 mg | ORAL_TABLET | Freq: Every day | ORAL | 1 refills | Status: DC

## 2021-09-22 NOTE — TOC CM/SW Note (Signed)
Patient has orders to discharge home today. Chart reviewed. PCP is Maudie Flakes, MD. On room air. No wounds. No TOC needs identified. CSW signing off.  Charlynn Court, CSW [phone]

## 2021-09-22 NOTE — Progress Notes (Signed)
[facility] Encounter Note  Patient: Kevin Acosta / Admit Date: 09/20/2021 / Date of Encounter: 09/22/2021, 7:29 AM   Subjective: Patient feels well today with no evidence of significant shortness of breath cough or congestion or chest pain.  The patient remains in normal sinus rhythm after discontinuation of diltiazem and diltiazem drip.  Patient has been continued on metoprolol at this time which appears to be working fairly well.  The likely issues has been inflammatory issues with post pericarditis and other viral issue.  The patient now has improved from these and remains in normal rhythm  Review of Systems: Positive for: None Negative for: Vision change, hearing change, syncope, dizziness, nausea, vomiting,diarrhea, bloody stool, stomach pain, cough, congestion, diaphoresis, urinary frequency, urinary pain,skin lesions, skin rashes Others previously listed  Objective: Telemetry: Normal sinus rhythm Physical Exam: Blood pressure (!) 150/96, pulse 94, temperature 98.4 F (36.9 C), resp. rate 18, height 5\' 10"  (1.778 m), weight 74.8 kg, SpO2 95 %. Body mass index is 23.68 kg/m. General: Well developed, well nourished, in no acute distress. Head: Normocephalic, atraumatic, sclera non-icteric, no xanthomas, nares are without discharge. Neck: No apparent masses Lungs: Normal respirations with no wheezes, no rhonchi, no rales , no crackles   Heart: Regular rate and rhythm, normal S1 S2, no murmur, no rub, no gallop, PMI is normal size and placement, carotid upstroke normal without bruit, jugular venous pressure normal Abdomen: Soft, non-tender, non-distended with normoactive bowel sounds. No hepatosplenomegaly. Abdominal aorta is normal size without bruit Extremities: No edema, no clubbing, no cyanosis, no ulcers,  Peripheral: 2+ radial, 2+ femoral, 2+ dorsal pedal pulses Neuro: Alert and oriented. Moves all extremities spontaneously. Psych:  Responds to questions  appropriately with a normal affect.  No intake or output data in the 24 hours ending 09/22/21 0729  Inpatient Medications:   apixaban  5 mg Oral BID   ascorbic acid  500 mg Oral Daily   cholecalciferol  1,000 Units Oral Daily   colchicine  0.6 mg Oral Daily   metoprolol tartrate  25 mg Oral BID   multivitamin with minerals  1 tablet Oral Daily   omega-3 acid ethyl esters  1 capsule Oral Daily   pantoprazole  40 mg Oral Daily   zinc sulfate  220 mg Oral Daily   Infusions:   Labs: Recent Labs    09/20/21 2221 09/21/21 0430 09/22/21 0458  NA  --  140 140  K  --  3.6 3.9  CL  --  111 112*  CO2  --  25 23  GLUCOSE  --  127* 102*  BUN  --  11 11  CREATININE  --  0.86 0.93  CALCIUM  --  8.0* 8.3*  MG 2.0  --   --    No results for input(s): "AST", "ALT", "ALKPHOS", "BILITOT", "PROT", "ALBUMIN" in the last 72 hours. Recent Labs    09/20/21 1446 09/21/21 0430  WBC 10.7* 10.0  HGB 14.4 12.2*  HCT 45.6 37.3*  MCV 94.0 91.6  PLT 397 386   No results for input(s): "CKTOTAL", "CKMB", "TROPONINI" in the last 72 hours. Invalid input(s): "POCBNP" No results for input(s): "HGBA1C" in the last 72 hours.   Weights: Filed Weights   09/20/21 1444  Weight: 74.8 kg     Radiology/Studies:  DG Chest 2 View  Result Date: 09/20/2021 CLINICAL DATA:  Tachycardia. EXAM: CHEST - 2 VIEW COMPARISON:  09/18/2021 FINDINGS: Heart size remains normal. Moderate left pleural effusion and small  right pleural effusion show increase in size since previous study. Increased atelectasis also noted in left lung base. IMPRESSION: Increased size of bilateral pleural effusions, left side greater than right. Increased left basilar atelectasis. Electronically Signed   By: Danae Orleans M.D.   On: 09/20/2021 16:06   DG Chest Portable 1 View  Result Date: 09/18/2021 CLINICAL DATA:  Tachycardia EXAM: PORTABLE CHEST 1 VIEW COMPARISON:  09/07/2021 FINDINGS: The heart size and mediastinal contours are within  normal limits. Patchy right lower lobe airspace opacity with small right pleural effusion. Possible trace left pleural effusion. No pneumothorax. The visualized skeletal structures are unremarkable. IMPRESSION: Patchy right lower lobe airspace opacity with small right pleural effusion. Possible trace left pleural effusion. Electronically Signed   By: Duanne Guess D.O.   On: 09/18/2021 15:50   ECHOCARDIOGRAM COMPLETE  Result Date: 09/08/2021    ECHOCARDIOGRAM REPORT   Patient Name:   Kevin Acosta Date of Exam: 09/08/2021 Medical Rec #:  [id number]        Height:       70.0 in Accession #:    [id number]       Weight:       160.0 lb Date of Birth:  May 06, 1945        BSA:          1.898 m Patient Age:    76 years         BP:           122/80 mmHg Patient Gender: M                HR:           95 bpm. Exam Location:  [facility] Procedure: 2D Echo, Cardiac Doppler and Color Doppler Indications:     Chest pain R07.9  History:         Patient has no prior history of Echocardiogram examinations.                  Risk Factors:Dyslipidemia and Hypertension. PE.  Sonographer:     Cristela Blue Referring Phys:  [id number] Brien Few NIU Diagnosing Phys: Arnoldo Hooker MD  Sonographer Comments: Image quality was fair. IMPRESSIONS  1. Left ventricular ejection fraction, by estimation, is 65 to 70%. The left ventricle has normal function. The left ventricle has no regional wall motion abnormalities. Left ventricular diastolic parameters were normal.  2. Right ventricular systolic function is normal. The right ventricular size is normal.  3. The mitral valve is normal in structure. Trivial mitral valve regurgitation.  4. The aortic valve is normal in structure. Aortic valve regurgitation is not visualized. FINDINGS  Left Ventricle: Left ventricular ejection fraction, by estimation, is 65 to 70%. The left ventricle has normal function. The left ventricle has no regional wall motion abnormalities. The left ventricular internal cavity size was  normal in size. There is  no left ventricular hypertrophy. Left ventricular diastolic parameters were normal. Right Ventricle: The right ventricular size is normal. No increase in right ventricular wall thickness. Right ventricular systolic function is normal. Left Atrium: Left atrial size was normal in size. Right Atrium: Right atrial size was normal in size. Pericardium: There is no evidence of pericardial effusion. Mitral Valve: The mitral valve is normal in structure. Trivial mitral valve regurgitation. Tricuspid Valve: The tricuspid valve is normal in structure. Tricuspid valve regurgitation is trivial. Aortic Valve: The aortic valve is normal in structure. Aortic valve regurgitation is not visualized. Aortic valve mean gradient measures 3.5 mmHg.  Aortic valve peak gradient measures 6.1 mmHg. Aortic valve area, by VTI measures 3.41 cm. Pulmonic Valve: The pulmonic valve was normal in structure. Pulmonic valve regurgitation is not visualized. Aorta: The aortic root and ascending aorta are structurally normal, with no evidence of dilitation. IAS/Shunts: No atrial level shunt detected by color flow Doppler.  LEFT VENTRICLE PLAX 2D LVIDd:         4.00 cm   Diastology LVIDs:         2.40 cm   LV e' medial:    7.18 cm/s LV PW:         1.30 cm   LV E/e' medial:  10.6 LV IVS:        1.00 cm   LV e' lateral:   6.20 cm/s LVOT diam:     2.00 cm   LV E/e' lateral: 12.2 LV SV:         67 LV SV Index:   35 LVOT Area:     3.14 cm  RIGHT VENTRICLE RV Basal diam:  3.10 cm RV S prime:     16.40 cm/s TAPSE (M-mode): 2.3 cm LEFT ATRIUM             Index        RIGHT ATRIUM           Index LA diam:        2.60 cm 1.37 cm/m   RA Area:     12.70 cm LA Vol (A2C):   47.3 ml 24.91 ml/m  RA Volume:   30.60 ml  16.12 ml/m LA Vol (A4C):   46.5 ml 24.49 ml/m LA Biplane Vol: 47.8 ml 25.18 ml/m  AORTIC VALVE AV Area (Vmax):    3.04 cm AV Area (Vmean):   2.93 cm AV Area (VTI):     3.41 cm AV Vmax:           123.00 cm/s AV Vmean:           87.550 cm/s AV VTI:            0.196 m AV Peak Grad:      6.1 mmHg AV Mean Grad:      3.5 mmHg LVOT Vmax:         119.00 cm/s LVOT Vmean:        81.700 cm/s LVOT VTI:          0.213 m LVOT/AV VTI ratio: 1.09  AORTA Ao Root diam: 3.35 cm MITRAL VALVE               TRICUSPID VALVE MV Area (PHT): 3.58 cm    TR Peak grad:   14.0 mmHg MV Decel Time: 212 msec    TR Vmax:        187.00 cm/s MV E velocity: 75.80 cm/s MV A velocity: 99.80 cm/s  SHUNTS MV E/A ratio:  0.76        Systemic VTI:  0.21 m                            Systemic Diam: 2.00 cm Arnoldo Hooker MD Electronically signed by Arnoldo Hooker MD Signature Date/Time: 09/08/2021/12:00:53 PM    Final    US Venous Img Lower Bilateral (DVT)  Result Date: 09/07/2021 CLINICAL DATA:  Positive D-dimer EXAM: BILATERAL LOWER EXTREMITY VENOUS DOPPLER ULTRASOUND TECHNIQUE: Gray-scale sonography with compression, as well as color and duplex ultrasound, were performed to evaluate the deep venous system(s) from  the level of the common femoral vein through the popliteal and proximal calf veins. COMPARISON:  None Available. FINDINGS: VENOUS Normal compressibility of the common femoral, superficial femoral, and popliteal veins, as well as the visualized calf veins. Visualized portions of profunda femoral vein and great saphenous vein unremarkable. No filling defects to suggest DVT on grayscale or color Doppler imaging. Doppler waveforms show normal direction of venous flow, normal respiratory plasticity and response to augmentation. Limited views of the contralateral common femoral vein are unremarkable. OTHER None. Limitations: none IMPRESSION: Negative. Electronically Signed   By: Gerome Sam III M.D.   On: 09/07/2021 12:59   NM Pulmonary Perfusion  Result Date: 09/07/2021 CLINICAL DATA:  Pulmonary embolism (PE) suspected, high prob EXAM: NUCLEAR MEDICINE PERFUSION LUNG SCAN TECHNIQUE: Perfusion images were obtained in multiple projections after intravenous  injection of radiopharmaceutical. Ventilation scans intentionally deferred if perfusion scan and chest x-ray adequate for interpretation during COVID 19 epidemic. RADIOPHARMACEUTICALS:  4.32 mCi Tc-17m MAA IV COMPARISON:  Same day chest x-ray FINDINGS: Physiologic distribution of radiotracer throughout both lungs. No segmental or larger perfusion defect. IMPRESSION: Low probability for pulmonary embolism. Electronically Signed   By: Duanne Guess D.O.   On: 09/07/2021 11:27   DG Chest Portable 1 View  Result Date: 09/07/2021 CLINICAL DATA:  76 year old male with chest pain radiating to the back. Shortness of breath. EXAM: PORTABLE CHEST 1 VIEW COMPARISON:  Chest radiographs 09/04/2021 and earlier. FINDINGS: Portable AP upright view at 0805 hours. Lower lung volumes with mild crowding of markings. Mediastinal contours remain within normal limits. Visualized tracheal air column is within normal limits. No pneumothorax, pleural effusion or confluent pulmonary opacity. No pulmonary edema suspected. Stable visualized osseous structures. Negative visible bowel gas. IMPRESSION: Lower lung volumes.  No acute cardiopulmonary abnormality. Electronically Signed   By: Odessa Fleming M.D.   On: 09/07/2021 08:19   DG Chest 2 View  Result Date: 09/04/2021 CLINICAL DATA:  Fall pneumonia EXAM: CHEST - 2 VIEW COMPARISON:  05/17/2021 FINDINGS: The heart size and mediastinal contours are within normal limits. Interval resolution of previously seen right perihilar airspace opacity. Both lungs are clear. The visualized skeletal structures are unremarkable. IMPRESSION: No active cardiopulmonary disease. Interval resolution of previously seen right perihilar airspace opacity. Electronically Signed   By: Duanne Guess D.O.   On: 09/04/2021 15:56     Assessment and Recommendation  76 y.o. male with recent episode of pericarditis with other inflammatory issues also causing likely atrial flutter with rapid ventricular rate now  spontaneously converted to normal sinus rhythm with significant improvements in symptoms and no current evidence of congestive heart failure or acute coronary syndrome. 1.  Continuation of metoprolol for heart rate control and maintenance of normal sinus rhythm 2.  Continue Eliquis for 3 weeks for further risk reduction of stroke with possible recurrence of atrial flutter 3.  Continue medication management for inflammatory issues including colchicine for further risk reduction in recurrence of pericarditis 4.  No further cardiac diagnostics necessary at this time 5.  Okay for discharged home from cardiac standpoint with follow-up next week  Signed, Arnoldo Hooker M.D. FACC

## 2021-09-22 NOTE — Discharge Summary (Signed)
Physician Discharge Summary   Patient: Kevin Acosta MRN: [id number] DOB: [birth year]  Admit date:     09/20/2021  Discharge date: 09/22/21  Discharge Physician: Arnetha Courser   PCP: Marina Goodell, MD   Recommendations at discharge:  Please obtain CBC and BMP in 1 week Follow-up with primary care provider and cardiology within a week.  Discharge Diagnoses: Principal Problem:   Atrial flutter with rapid ventricular response (HCC) Active Problems:   Elevated troponin   Gout   GERD without esophagitis   Hospital Course: Taken from H&P.  Kevin Acosta is a 76 y.o. Caucasian male with medical history significant for paroxysmal atrial flutter, hypertension, dyslipidemia and PE on Eliquis, who presented to the ER with acute onset of fatigue that started this morning with tachycardia without associated palpitations.  The patient was just discharged a couple days ago after being managed for atrial flutter with rapid ventricular response, which has been responding to diltiazem.  He was discharged on p.o. diltiazem and metoprolol.  Yesterday his heart rate was in the 90s in the morning and during the day.  When he woke up this morning and was having fatigue and palpitations his heart rate was in the 160s.  He continued to feel tired throughout the day.  He admits to dyspnea without cough or wheezing.  No chest pain.  No nausea or vomiting or abdominal pain.  No bleeding diathesis.  No fever or chills.  No dysuria, oliguria or hematuria or flank pain.  No worsening leg pain or edema or recent travels.   The patient was just discharged from here on 8/10 after being managed for atrial flutter with RVR, responding to IV Cardizem and p.o. metoprolol with slowing of his rhythm without conversion.  He was discharged on Cardizem CD 140 mg p.o. daily and metoprolol 25 mg p.o. twice daily in addition to Eliquis 5 mg p.o. twice daily with prophylactic Protonix.   ED Course: Upon presentation to the  emergency room, BP 145/113 heart rate of 163 with otherwise normal vital signs.  He was in atrial flutter with RVR.  BMP was unremarkable and CBC showed WBC of 10.7.  INR was 1.4 with PT 17. EKG as reviewed by me : EKG showed atrial flutter with 2-1 AV conduction with a rate of 165 with poor R wave progression. Imaging: Two-view chest x-ray showed left basal atelectasis.   Patient was given IV diltiazem drip and 500 mL IV normal saline bolus.  He will be admitted to a PCU bed for further evaluation and management.  8/13: Patient spontaneously converted back to sinus rhythm.  Trying to wean him off from diltiazem infusion.  Metoprolol 25 mg twice daily was added by cardiology.  8/14: Patient remained stable and in sinus rhythm.  He was weaned off from diltiazem infusion.  He is being discharged on metoprolol 25 mg twice daily,  and added low-dose losartan for better control of blood pressure. He will continue taking Eliquis and amiodarone which will be tapered off as an outpatient.  Patient will continue on current medications and need to have a close follow-up with his providers for further recommendations.  Assessment and Plan: * Atrial flutter with rapid ventricular response (HCC) Spontaneously converted back to sinus rhythm. -Try weaning him off from Cardizem -Cardiology started him on metoprolol -Continue with Eliquis  Elevated troponin -This like secondary to his atrial flutter with RVR. - He has no current chest pain. - Serial troponin with a flat curve.  GERD without esophagitis - We will continue PPI therapy.  Gout - We will continue colchicine.   Consultants: Card neurology Procedures performed: None Disposition: Home Diet recommendation:  Discharge Diet Orders (From admission, onward)     Start     Ordered   09/22/21 0000  Diet - low sodium heart healthy        09/22/21 1010           Cardiac diet DISCHARGE MEDICATION: Allergies as of 09/22/2021        Reactions   Contrast Media [iodinated Contrast Media] Other (See Comments)   Hypotension, Vagal, "coded"        Medication List     STOP taking these medications    diltiazem 240 MG 24 hr capsule Commonly known as: CARDIZEM CD       TAKE these medications    apixaban 5 MG Tabs tablet Commonly known as: ELIQUIS Take 1 tablet (5 mg total) by mouth 2 (two) times daily.   ascorbic acid 500 MG tablet Commonly known as: VITAMIN C Take 500 mg by mouth daily.   Centrum Silver 50+Men Tabs Take 1 tablet by mouth daily.   colchicine 0.6 MG tablet Take 1 tablet (0.6 mg total) by mouth 2 (two) times daily.   Fish Oil Omega-3 1000 MG Caps Take 1 capsule by mouth daily.   losartan 25 MG tablet Commonly known as: COZAAR Take 1 tablet (25 mg total) by mouth daily.   metoprolol tartrate 25 MG tablet Commonly known as: LOPRESSOR Take 1 tablet (25 mg total) by mouth 2 (two) times daily.   pantoprazole 40 MG tablet Commonly known as: PROTONIX Take 1 tablet (40 mg total) by mouth daily.   Vitamin D3 25 MCG (1000 UT) Caps Take 1,000 Units by mouth daily.   Zinc 50 MG Tabs Take 50 mg by mouth daily.        Follow-up Information     Feldpausch, Madaline Guthrie, MD. Schedule an appointment as soon as possible for a visit in 1 week(s).   Specialty: Family Medicine Contact information: [address] [place] [address] [phone]         Lamar Blinks, MD. Schedule an appointment as soon as possible for a visit in 1 week(s).   Specialty: Cardiology Contact information: [address] [place] [facility] [place] [address] [phone]                Discharge Exam: Ceasar Mons Weights   09/20/21 1444  Weight: 74.8 kg   General.     In no acute distress. Pulmonary.  Lungs clear bilaterally, normal respiratory effort. CV.  Regular rate and rhythm, no JVD, rub or murmur. Abdomen.  Soft, nontender, nondistended, BS positive. CNS.  Alert and  oriented .  No focal neurologic deficit. Extremities.  No edema, no cyanosis, pulses intact and symmetrical. Psychiatry.  Judgment and insight appears normal.   Condition at discharge: stable  The results of significant diagnostics from this hospitalization (including imaging, microbiology, ancillary and laboratory) are listed below for reference.   Imaging Studies: DG Chest 2 View  Result Date: 09/20/2021 CLINICAL DATA:  Tachycardia. EXAM: CHEST - 2 VIEW COMPARISON:  09/18/2021 FINDINGS: Heart size remains normal. Moderate left pleural effusion and small right pleural effusion show increase in size since previous study. Increased atelectasis also noted in left lung base. IMPRESSION: Increased size of bilateral pleural effusions, left side greater than right. Increased left basilar atelectasis. Electronically Signed   By:  Danae Orleans M.D.   On: 09/20/2021 16:06   DG Chest Portable 1 View  Result Date: 09/18/2021 CLINICAL DATA:  Tachycardia EXAM: PORTABLE CHEST 1 VIEW COMPARISON:  09/07/2021 FINDINGS: The heart size and mediastinal contours are within normal limits. Patchy right lower lobe airspace opacity with small right pleural effusion. Possible trace left pleural effusion. No pneumothorax. The visualized skeletal structures are unremarkable. IMPRESSION: Patchy right lower lobe airspace opacity with small right pleural effusion. Possible trace left pleural effusion. Electronically Signed   By: Duanne Guess D.O.   On: 09/18/2021 15:50   ECHOCARDIOGRAM COMPLETE  Result Date: 09/08/2021    ECHOCARDIOGRAM REPORT   Patient Name:   PATRIC BUCKHALTER Date of Exam: 09/08/2021 Medical Rec #:  [id number]        Height:       70.0 in Accession #:    [id number]       Weight:       160.0 lb Date of Birth:  [birth year]        BSA:          1.898 m Patient Age:    76 years         BP:           122/80 mmHg Patient Gender: M                HR:           95 bpm. Exam Location:  [facility] Procedure: 2D Echo, Cardiac  Doppler and Color Doppler Indications:     Chest pain R07.9  History:         Patient has no prior history of Echocardiogram examinations.                  Risk Factors:Dyslipidemia and Hypertension. PE.  Sonographer:     Cristela Blue Referring Phys:  [id number] Brien Few NIU Diagnosing Phys: Arnoldo Hooker MD  Sonographer Comments: Image quality was fair. IMPRESSIONS  1. Left ventricular ejection fraction, by estimation, is 65 to 70%. The left ventricle has normal function. The left ventricle has no regional wall motion abnormalities. Left ventricular diastolic parameters were normal.  2. Right ventricular systolic function is normal. The right ventricular size is normal.  3. The mitral valve is normal in structure. Trivial mitral valve regurgitation.  4. The aortic valve is normal in structure. Aortic valve regurgitation is not visualized. FINDINGS  Left Ventricle: Left ventricular ejection fraction, by estimation, is 65 to 70%. The left ventricle has normal function. The left ventricle has no regional wall motion abnormalities. The left ventricular internal cavity size was normal in size. There is  no left ventricular hypertrophy. Left ventricular diastolic parameters were normal. Right Ventricle: The right ventricular size is normal. No increase in right ventricular wall thickness. Right ventricular systolic function is normal. Left Atrium: Left atrial size was normal in size. Right Atrium: Right atrial size was normal in size. Pericardium: There is no evidence of pericardial effusion. Mitral Valve: The mitral valve is normal in structure. Trivial mitral valve regurgitation. Tricuspid Valve: The tricuspid valve is normal in structure. Tricuspid valve regurgitation is trivial. Aortic Valve: The aortic valve is normal in structure. Aortic valve regurgitation is not visualized. Aortic valve mean gradient measures 3.5 mmHg. Aortic valve peak gradient measures 6.1 mmHg. Aortic valve area, by VTI measures 3.41 cm. Pulmonic  Valve: The pulmonic valve was normal in structure. Pulmonic valve regurgitation is not visualized. Aorta: The aortic root and ascending aorta are structurally  normal, with no evidence of dilitation. IAS/Shunts: No atrial level shunt detected by color flow Doppler.  LEFT VENTRICLE PLAX 2D LVIDd:         4.00 cm   Diastology LVIDs:         2.40 cm   LV e' medial:    7.18 cm/s LV PW:         1.30 cm   LV E/e' medial:  10.6 LV IVS:        1.00 cm   LV e' lateral:   6.20 cm/s LVOT diam:     2.00 cm   LV E/e' lateral: 12.2 LV SV:         67 LV SV Index:   35 LVOT Area:     3.14 cm  RIGHT VENTRICLE RV Basal diam:  3.10 cm RV S prime:     16.40 cm/s TAPSE (M-mode): 2.3 cm LEFT ATRIUM             Index        RIGHT ATRIUM           Index LA diam:        2.60 cm 1.37 cm/m   RA Area:     12.70 cm LA Vol (A2C):   47.3 ml 24.91 ml/m  RA Volume:   30.60 ml  16.12 ml/m LA Vol (A4C):   46.5 ml 24.49 ml/m LA Biplane Vol: 47.8 ml 25.18 ml/m  AORTIC VALVE AV Area (Vmax):    3.04 cm AV Area (Vmean):   2.93 cm AV Area (VTI):     3.41 cm AV Vmax:           123.00 cm/s AV Vmean:          87.550 cm/s AV VTI:            0.196 m AV Peak Grad:      6.1 mmHg AV Mean Grad:      3.5 mmHg LVOT Vmax:         119.00 cm/s LVOT Vmean:        81.700 cm/s LVOT VTI:          0.213 m LVOT/AV VTI ratio: 1.09  AORTA Ao Root diam: 3.35 cm MITRAL VALVE               TRICUSPID VALVE MV Area (PHT): 3.58 cm    TR Peak grad:   14.0 mmHg MV Decel Time: 212 msec    TR Vmax:        187.00 cm/s MV E velocity: 75.80 cm/s MV A velocity: 99.80 cm/s  SHUNTS MV E/A ratio:  0.76        Systemic VTI:  0.21 m                            Systemic Diam: 2.00 cm Arnoldo Hooker MD Electronically signed by Arnoldo Hooker MD Signature Date/Time: 09/08/2021/12:00:53 PM    Final    US Venous Img Lower Bilateral (DVT)  Result Date: 09/07/2021 CLINICAL DATA:  Positive D-dimer EXAM: BILATERAL LOWER EXTREMITY VENOUS DOPPLER ULTRASOUND TECHNIQUE: Gray-scale sonography with  compression, as well as color and duplex ultrasound, were performed to evaluate the deep venous system(s) from the level of the common femoral vein through the popliteal and proximal calf veins. COMPARISON:  None Available. FINDINGS: VENOUS Normal compressibility of the common femoral, superficial femoral, and popliteal veins, as well as the visualized calf veins. Visualized  portions of profunda femoral vein and great saphenous vein unremarkable. No filling defects to suggest DVT on grayscale or color Doppler imaging. Doppler waveforms show normal direction of venous flow, normal respiratory plasticity and response to augmentation. Limited views of the contralateral common femoral vein are unremarkable. OTHER None. Limitations: none IMPRESSION: Negative. Electronically Signed   By: Gerome Sam III M.D.   On: 09/07/2021 12:59   NM Pulmonary Perfusion  Result Date: 09/07/2021 CLINICAL DATA:  Pulmonary embolism (PE) suspected, high prob EXAM: NUCLEAR MEDICINE PERFUSION LUNG SCAN TECHNIQUE: Perfusion images were obtained in multiple projections after intravenous injection of radiopharmaceutical. Ventilation scans intentionally deferred if perfusion scan and chest x-ray adequate for interpretation during COVID 19 epidemic. RADIOPHARMACEUTICALS:  4.32 mCi Tc-36m MAA IV COMPARISON:  Same day chest x-ray FINDINGS: Physiologic distribution of radiotracer throughout both lungs. No segmental or larger perfusion defect. IMPRESSION: Low probability for pulmonary embolism. Electronically Signed   By: Duanne Guess D.O.   On: 09/07/2021 11:27   DG Chest Portable 1 View  Result Date: 09/07/2021 CLINICAL DATA:  76 year old male with chest pain radiating to the back. Shortness of breath. EXAM: PORTABLE CHEST 1 VIEW COMPARISON:  Chest radiographs 09/04/2021 and earlier. FINDINGS: Portable AP upright view at 0805 hours. Lower lung volumes with mild crowding of markings. Mediastinal contours remain within normal limits.  Visualized tracheal air column is within normal limits. No pneumothorax, pleural effusion or confluent pulmonary opacity. No pulmonary edema suspected. Stable visualized osseous structures. Negative visible bowel gas. IMPRESSION: Lower lung volumes.  No acute cardiopulmonary abnormality. Electronically Signed   By: Odessa Fleming M.D.   On: 09/07/2021 08:19   DG Chest 2 View  Result Date: 09/04/2021 CLINICAL DATA:  Fall pneumonia EXAM: CHEST - 2 VIEW COMPARISON:  05/17/2021 FINDINGS: The heart size and mediastinal contours are within normal limits. Interval resolution of previously seen right perihilar airspace opacity. Both lungs are clear. The visualized skeletal structures are unremarkable. IMPRESSION: No active cardiopulmonary disease. Interval resolution of previously seen right perihilar airspace opacity. Electronically Signed   By: Duanne Guess D.O.   On: 09/04/2021 15:56    Microbiology: Results for orders placed or performed during the hospital encounter of 09/18/21  SARS Coronavirus 2 by RT PCR (hospital order, performed in [facility] hospital lab) *cepheid single result test* Anterior Nasal Swab     Status: None   Collection Time: 09/18/21  4:12 PM   Specimen: Anterior Nasal Swab  Result Value Ref Range Status   SARS Coronavirus 2 by RT PCR NEGATIVE NEGATIVE Final    Comment: (NOTE) SARS-CoV-2 target nucleic acids are NOT DETECTED.  The SARS-CoV-2 RNA is generally detectable in upper and lower respiratory specimens during the acute phase of infection. The lowest concentration of SARS-CoV-2 viral copies this assay can detect is 250 copies / mL. A negative result does not preclude SARS-CoV-2 infection and should not be used as the sole basis for treatment or other patient management decisions.  A negative result may occur with improper specimen collection / handling, submission of specimen other than nasopharyngeal swab, presence of viral mutation(s) within the areas targeted by this  assay, and inadequate number of viral copies (<250 copies / mL). A negative result must be combined with clinical observations, patient history, and epidemiological information.  Fact Sheet for Patients:   RoadLapTop.co.za  Fact Sheet for Healthcare Providers: http://kim-miller.com/  This test is not yet approved or  cleared by the [place] FDA and has been authorized for detection and/or  diagnosis of SARS-CoV-2 by FDA under an Emergency Use Authorization (EUA).  This EUA will remain in effect (meaning this test can be used) for the duration of the COVID-19 declaration under Section 564(b)(1) of the Act, 21 U.S.C. section 360bbb-3(b)(1), unless the authorization is terminated or revoked sooner.  Performed at [facility], [address] [place], [place] [id number]     Labs: CBC: Recent Labs  Lab 09/18/21 1517 09/19/21 0509 09/20/21 1446 09/21/21 0430  WBC 12.4* 12.2* 10.7* 10.0  NEUTROABS 8.9*  --   --   --   HGB 13.9 11.9* 14.4 12.2*  HCT 42.9 37.1* 45.6 37.3*  MCV 93.3 91.6 94.0 91.6  PLT 377 339 397 386   Basic Metabolic Panel: Recent Labs  Lab 09/18/21 1517 09/18/21 1606 09/19/21 0509 09/20/21 1446 09/20/21 2221 09/21/21 0430 09/22/21 0458  NA 138  --  139 138  --  140 140  K 3.4*  --  3.5 3.6  --  3.6 3.9  CL 101  --  110 108  --  111 112*  CO2 24  --  24 21*  --  25 23  GLUCOSE 157*  --  123* 115*  --  127* 102*  BUN 20  --  19 14  --  11 11  CREATININE 1.11  --  0.90 0.86  --  0.86 0.93  CALCIUM 8.7*  --  7.9* 8.6*  --  8.0* 8.3*  MG  --  2.2  --   --  2.0  --   --    Liver Function Tests: Recent Labs  Lab 09/18/21 1517  AST 32  ALT 47*  ALKPHOS 68  BILITOT 1.1  PROT 7.3  ALBUMIN 3.2*   CBG: No results for input(s): "GLUCAP" in the last 168 hours.  Discharge time spent: greater than 30 minutes.  This record has been created using Conservation officer, historic buildingsDragon voice recognition software. Errors have  been sought and corrected,but may not always be located. Such creation errors do not reflect on the standard of care.   Signed: Arnetha CourserSumayya Taleshia Luff, MD [facility] 09/22/2021

## 2021-09-24 DIAGNOSIS — Z09 Encounter for follow-up examination after completed treatment for conditions other than malignant neoplasm: Secondary | ICD-10-CM | POA: Diagnosis not present

## 2021-09-24 DIAGNOSIS — J90 Pleural effusion, not elsewhere classified: Secondary | ICD-10-CM | POA: Diagnosis not present

## 2021-09-24 DIAGNOSIS — I4892 Unspecified atrial flutter: Secondary | ICD-10-CM | POA: Diagnosis not present

## 2021-09-24 DIAGNOSIS — I10 Essential (primary) hypertension: Secondary | ICD-10-CM | POA: Diagnosis not present

## 2021-09-24 DIAGNOSIS — E7800 Pure hypercholesterolemia, unspecified: Secondary | ICD-10-CM | POA: Diagnosis not present

## 2021-09-29 DIAGNOSIS — Z09 Encounter for follow-up examination after completed treatment for conditions other than malignant neoplasm: Secondary | ICD-10-CM | POA: Diagnosis not present

## 2021-09-29 DIAGNOSIS — E7800 Pure hypercholesterolemia, unspecified: Secondary | ICD-10-CM | POA: Diagnosis not present

## 2021-10-02 DIAGNOSIS — I309 Acute pericarditis, unspecified: Secondary | ICD-10-CM | POA: Diagnosis not present

## 2021-10-02 DIAGNOSIS — R059 Cough, unspecified: Secondary | ICD-10-CM | POA: Diagnosis not present

## 2021-10-02 DIAGNOSIS — R058 Other specified cough: Secondary | ICD-10-CM | POA: Diagnosis not present

## 2021-10-02 DIAGNOSIS — R0781 Pleurodynia: Secondary | ICD-10-CM | POA: Diagnosis not present

## 2021-10-02 DIAGNOSIS — R609 Edema, unspecified: Secondary | ICD-10-CM | POA: Diagnosis not present

## 2021-10-02 DIAGNOSIS — R079 Chest pain, unspecified: Secondary | ICD-10-CM | POA: Diagnosis not present

## 2021-10-02 DIAGNOSIS — J90 Pleural effusion, not elsewhere classified: Secondary | ICD-10-CM | POA: Diagnosis not present

## 2021-10-02 DIAGNOSIS — R0789 Other chest pain: Secondary | ICD-10-CM | POA: Diagnosis not present

## 2021-10-02 DIAGNOSIS — R600 Localized edema: Secondary | ICD-10-CM | POA: Diagnosis not present

## 2021-10-08 DIAGNOSIS — I4892 Unspecified atrial flutter: Secondary | ICD-10-CM | POA: Diagnosis not present

## 2021-10-08 DIAGNOSIS — I10 Essential (primary) hypertension: Secondary | ICD-10-CM | POA: Diagnosis not present

## 2021-10-08 DIAGNOSIS — E7800 Pure hypercholesterolemia, unspecified: Secondary | ICD-10-CM | POA: Diagnosis not present

## 2021-10-14 DIAGNOSIS — I4892 Unspecified atrial flutter: Secondary | ICD-10-CM | POA: Diagnosis not present

## 2021-10-14 DIAGNOSIS — B369 Superficial mycosis, unspecified: Secondary | ICD-10-CM | POA: Diagnosis not present

## 2021-10-14 DIAGNOSIS — I10 Essential (primary) hypertension: Secondary | ICD-10-CM | POA: Diagnosis not present

## 2021-10-14 DIAGNOSIS — E7800 Pure hypercholesterolemia, unspecified: Secondary | ICD-10-CM | POA: Diagnosis not present

## 2021-10-14 DIAGNOSIS — Z0000 Encounter for general adult medical examination without abnormal findings: Secondary | ICD-10-CM | POA: Diagnosis not present

## 2021-10-14 DIAGNOSIS — Z1389 Encounter for screening for other disorder: Secondary | ICD-10-CM | POA: Diagnosis not present

## 2021-10-14 DIAGNOSIS — E119 Type 2 diabetes mellitus without complications: Secondary | ICD-10-CM | POA: Diagnosis not present

## 2021-10-14 DIAGNOSIS — D500 Iron deficiency anemia secondary to blood loss (chronic): Secondary | ICD-10-CM | POA: Diagnosis not present

## 2021-11-19 DIAGNOSIS — I10 Essential (primary) hypertension: Secondary | ICD-10-CM | POA: Diagnosis not present

## 2021-11-19 DIAGNOSIS — I4892 Unspecified atrial flutter: Secondary | ICD-10-CM | POA: Diagnosis not present

## 2021-11-19 DIAGNOSIS — E7800 Pure hypercholesterolemia, unspecified: Secondary | ICD-10-CM | POA: Diagnosis not present

## 2022-02-17 DIAGNOSIS — I10 Essential (primary) hypertension: Secondary | ICD-10-CM | POA: Diagnosis not present

## 2022-02-17 DIAGNOSIS — I4892 Unspecified atrial flutter: Secondary | ICD-10-CM | POA: Diagnosis not present

## 2022-02-17 DIAGNOSIS — E7800 Pure hypercholesterolemia, unspecified: Secondary | ICD-10-CM | POA: Diagnosis not present

## 2022-03-16 DIAGNOSIS — L081 Erythrasma: Secondary | ICD-10-CM | POA: Diagnosis not present

## 2022-03-16 DIAGNOSIS — L239 Allergic contact dermatitis, unspecified cause: Secondary | ICD-10-CM | POA: Diagnosis not present

## 2022-04-28 DIAGNOSIS — D500 Iron deficiency anemia secondary to blood loss (chronic): Secondary | ICD-10-CM | POA: Diagnosis not present

## 2022-04-28 DIAGNOSIS — E119 Type 2 diabetes mellitus without complications: Secondary | ICD-10-CM | POA: Diagnosis not present

## 2022-04-28 DIAGNOSIS — E7800 Pure hypercholesterolemia, unspecified: Secondary | ICD-10-CM | POA: Diagnosis not present

## 2022-04-28 DIAGNOSIS — I10 Essential (primary) hypertension: Secondary | ICD-10-CM | POA: Diagnosis not present

## 2022-04-28 DIAGNOSIS — I4892 Unspecified atrial flutter: Secondary | ICD-10-CM | POA: Diagnosis not present

## 2022-06-29 IMAGING — CR DG CHEST 2V
2 series · 2 of 2 positions shown · non-contrast
Comparison: 12/05/2005

CLINICAL DATA: Cough and fever.

EXAM:
CHEST - 2 VIEW

[chest pa]
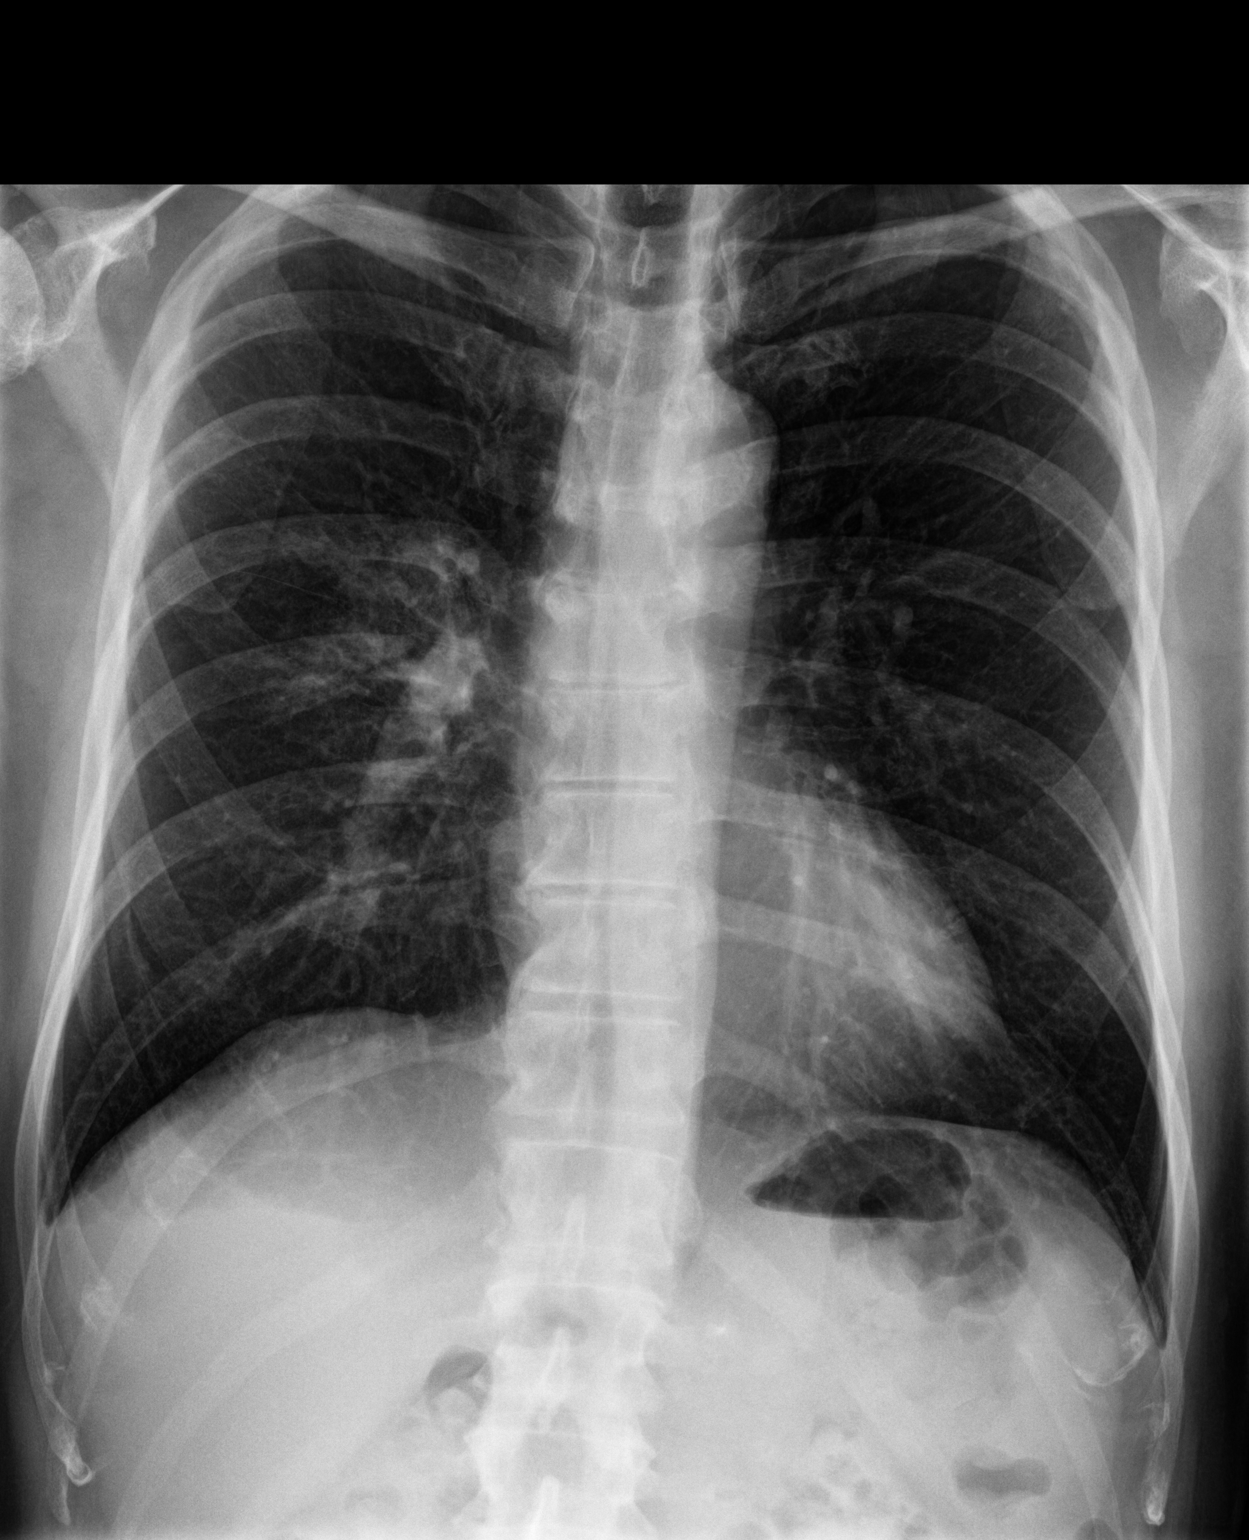

[chest lat]
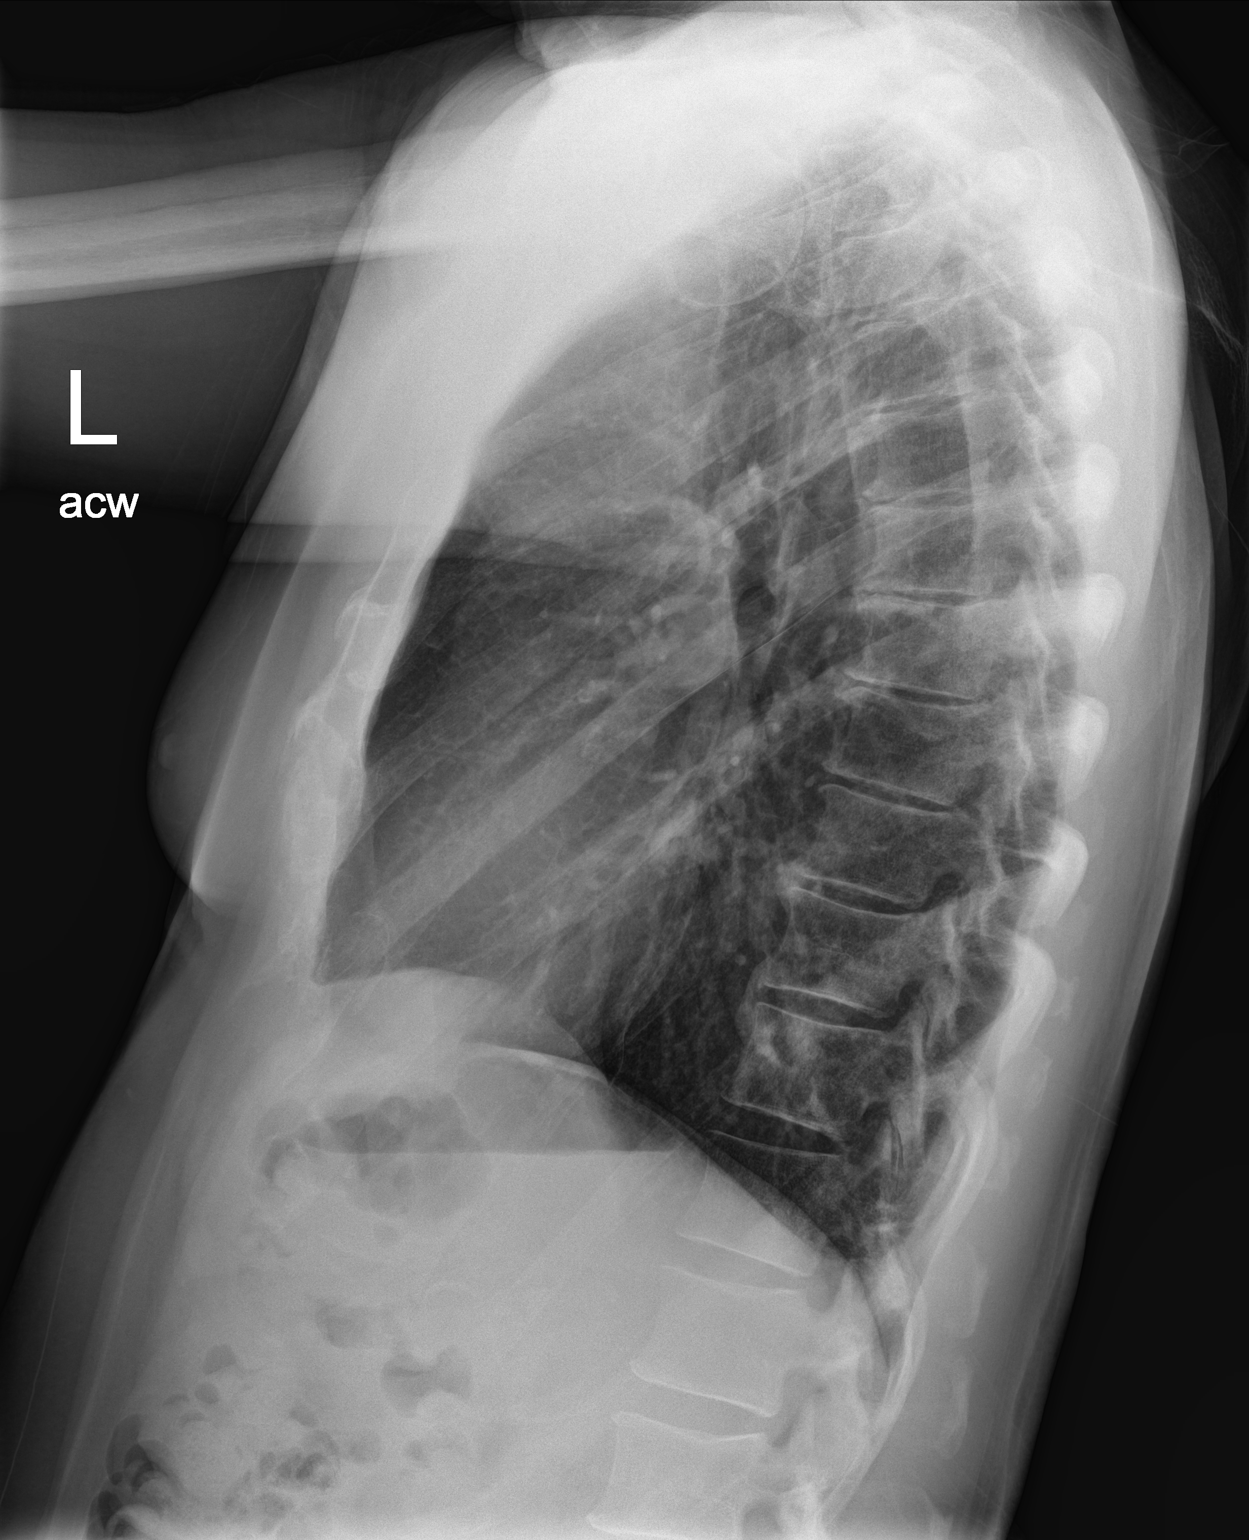

[2 of 2 positions shown; findings below may reference images not displayed]

FINDINGS: Focal ill-defined airspace disease identified parahilar right lung.
Left lung clear. Tiny right pleural effusion with no left pleural
effusion. No pulmonary edema. The cardiopericardial silhouette is
within normal limits for size. The visualized bony structures of the
thorax are unremarkable.
IMPRESSION: Focal ill-defined airspace disease parahilar right lung compatible
with pneumonia. Associated tiny right pleural effusion. Close
follow-up recommended to ensure complete resolution as central right
lung neoplasm not entirely excluded.

## 2022-08-19 DIAGNOSIS — I4892 Unspecified atrial flutter: Secondary | ICD-10-CM | POA: Diagnosis not present

## 2022-08-19 DIAGNOSIS — I10 Essential (primary) hypertension: Secondary | ICD-10-CM | POA: Diagnosis not present

## 2022-08-19 DIAGNOSIS — E7800 Pure hypercholesterolemia, unspecified: Secondary | ICD-10-CM | POA: Diagnosis not present

## 2022-11-03 DIAGNOSIS — I10 Essential (primary) hypertension: Secondary | ICD-10-CM | POA: Diagnosis not present

## 2022-11-03 DIAGNOSIS — E119 Type 2 diabetes mellitus without complications: Secondary | ICD-10-CM | POA: Diagnosis not present

## 2022-11-03 DIAGNOSIS — D500 Iron deficiency anemia secondary to blood loss (chronic): Secondary | ICD-10-CM | POA: Diagnosis not present

## 2022-11-03 DIAGNOSIS — Z1331 Encounter for screening for depression: Secondary | ICD-10-CM | POA: Diagnosis not present

## 2022-11-03 DIAGNOSIS — Z0000 Encounter for general adult medical examination without abnormal findings: Secondary | ICD-10-CM | POA: Diagnosis not present

## 2022-11-03 DIAGNOSIS — E7800 Pure hypercholesterolemia, unspecified: Secondary | ICD-10-CM | POA: Diagnosis not present

## 2022-11-03 DIAGNOSIS — I4892 Unspecified atrial flutter: Secondary | ICD-10-CM | POA: Diagnosis not present

## 2022-11-03 DIAGNOSIS — W57XXXD Bitten or stung by nonvenomous insect and other nonvenomous arthropods, subsequent encounter: Secondary | ICD-10-CM | POA: Diagnosis not present

## 2023-02-23 DIAGNOSIS — I10 Essential (primary) hypertension: Secondary | ICD-10-CM | POA: Diagnosis not present

## 2023-02-23 DIAGNOSIS — I4892 Unspecified atrial flutter: Secondary | ICD-10-CM | POA: Diagnosis not present

## 2023-02-23 DIAGNOSIS — E7800 Pure hypercholesterolemia, unspecified: Secondary | ICD-10-CM | POA: Diagnosis not present

## 2023-05-13 DIAGNOSIS — J069 Acute upper respiratory infection, unspecified: Secondary | ICD-10-CM | POA: Diagnosis not present

## 2023-05-13 DIAGNOSIS — E119 Type 2 diabetes mellitus without complications: Secondary | ICD-10-CM | POA: Diagnosis not present

## 2023-05-13 DIAGNOSIS — I10 Essential (primary) hypertension: Secondary | ICD-10-CM | POA: Diagnosis not present

## 2023-05-13 DIAGNOSIS — E7800 Pure hypercholesterolemia, unspecified: Secondary | ICD-10-CM | POA: Diagnosis not present

## 2023-05-13 DIAGNOSIS — I4892 Unspecified atrial flutter: Secondary | ICD-10-CM | POA: Diagnosis not present

## 2023-05-13 DIAGNOSIS — D500 Iron deficiency anemia secondary to blood loss (chronic): Secondary | ICD-10-CM | POA: Diagnosis not present

## 2023-05-24 DIAGNOSIS — M79672 Pain in left foot: Secondary | ICD-10-CM | POA: Diagnosis not present

## 2023-05-24 DIAGNOSIS — M7662 Achilles tendinitis, left leg: Secondary | ICD-10-CM | POA: Diagnosis not present

## 2023-05-24 DIAGNOSIS — M216X2 Other acquired deformities of left foot: Secondary | ICD-10-CM | POA: Diagnosis not present

## 2023-05-24 DIAGNOSIS — E119 Type 2 diabetes mellitus without complications: Secondary | ICD-10-CM | POA: Diagnosis not present

## 2023-05-24 DIAGNOSIS — M216X1 Other acquired deformities of right foot: Secondary | ICD-10-CM | POA: Diagnosis not present

## 2023-05-24 DIAGNOSIS — M7732 Calcaneal spur, left foot: Secondary | ICD-10-CM | POA: Diagnosis not present

## 2023-06-16 DIAGNOSIS — Z0100 Encounter for examination of eyes and vision without abnormal findings: Secondary | ICD-10-CM | POA: Diagnosis not present

## 2023-06-16 DIAGNOSIS — H2513 Age-related nuclear cataract, bilateral: Secondary | ICD-10-CM | POA: Diagnosis not present

## 2023-07-03 ENCOUNTER — Ambulatory Visit: Admission: EM | Admit: 2023-07-03 | Discharge: 2023-07-03 | Disposition: A | Discharge status: Home / Self Care

## 2023-07-03 ENCOUNTER — Encounter: Payer: Self-pay | Admitting: Emergency Medicine

## 2023-07-03 DIAGNOSIS — S0990XA Unspecified injury of head, initial encounter: Secondary | ICD-10-CM | POA: Diagnosis not present

## 2023-07-03 DIAGNOSIS — R7303 Prediabetes: Secondary | ICD-10-CM | POA: Diagnosis not present

## 2023-07-03 DIAGNOSIS — I4892 Unspecified atrial flutter: Secondary | ICD-10-CM | POA: Diagnosis not present

## 2023-07-03 DIAGNOSIS — M109 Gout, unspecified: Secondary | ICD-10-CM | POA: Diagnosis not present

## 2023-07-03 DIAGNOSIS — M47812 Spondylosis without myelopathy or radiculopathy, cervical region: Secondary | ICD-10-CM | POA: Diagnosis not present

## 2023-07-03 DIAGNOSIS — K219 Gastro-esophageal reflux disease without esophagitis: Secondary | ICD-10-CM | POA: Diagnosis not present

## 2023-07-03 DIAGNOSIS — Z043 Encounter for examination and observation following other accident: Secondary | ICD-10-CM | POA: Diagnosis not present

## 2023-07-03 DIAGNOSIS — I10 Essential (primary) hypertension: Secondary | ICD-10-CM | POA: Diagnosis not present

## 2023-07-03 DIAGNOSIS — E785 Hyperlipidemia, unspecified: Secondary | ICD-10-CM | POA: Diagnosis not present

## 2023-07-03 DIAGNOSIS — S0101XA Laceration without foreign body of scalp, initial encounter: Secondary | ICD-10-CM

## 2023-07-03 NOTE — ED Provider Notes (Signed)
 [facility]    CSN: [id number] Arrival date & time: 07/03/23  1551      History   Chief Complaint Chief Complaint  Patient presents with   Head Injury    HPI Kevin Acosta is a 78 y.o. male.   HPI  78 year old male with past medical history significant for PE, hypertension, hyperlipidemia, atrial flutter, gout, and GERD presents for evaluation of a head injury.  The patient was working on a well and the roof came down and struck him in the head and he was cut by a nail.  He did not suffer loss of consciousness and he reports that he has not had a change in vision, nausea, or vomiting.  Wife says he has not had any change in behavior.  He was able to walk back to the house under his own power to attend to the wound on his head.  Last tetanus shot was in 2021.  Past Medical History:  Diagnosis Date   Atrial flutter (HCC)    Hyperlipidemia    Hypertension    PE (pulmonary thromboembolism) [facility])     Patient Active Problem List   Diagnosis Date Noted   Atrial flutter with rapid ventricular response (HCC) 09/20/2021   Gout 09/20/2021   GERD without esophagitis 09/20/2021   Elevated troponin 09/20/2021   Atrial flutter, unspecified type (HCC) 09/18/2021   Chest pain 09/07/2021   Leukocytosis 09/07/2021   Lower GI bleed 07/03/2018   GI bleed 07/02/2018   Prediabetes 07/19/2015   Hx pulmonary embolism 07/18/2015   Hyperlipidemia 08/15/2014   Essential hypertension 08/15/2014   Arthritis 08/15/2014    Past Surgical History:  Procedure Laterality Date   NO PAST SURGERIES     VISCERAL ANGIOGRAPHY N/A 07/02/2018   Procedure: VISCERAL ANGIOGRAPHY with possible embolization;  Surgeon: Celso College, MD;  Location: [facility] INVASIVE CV LAB;  Service: Cardiovascular;  Laterality: N/A;       Home Medications    Prior to Admission medications   Medication Sig Start Date End Date Taking? Authorizing Provider  ascorbic acid  (VITAMIN C) 500 MG tablet Take 500 mg by  mouth daily.   Yes [provider]  Cholecalciferol  (VITAMIN D3) 25 MCG (1000 UT) CAPS Take 1,000 Units by mouth daily.   Yes [provider]  losartan  (COZAAR ) 25 MG tablet Take 1 tablet (25 mg total) by mouth daily. 09/22/21  Yes Luna Salinas, MD  Multiple Vitamins-Minerals (CENTRUM SILVER 50+MEN) TABS Take 1 tablet by mouth daily.   Yes [provider]  Omega-3 Fatty Acids (FISH OIL OMEGA-3) 1000 MG CAPS Take 1 capsule by mouth daily.   Yes [provider]  Zinc  50 MG TABS Take 50 mg by mouth daily.   Yes [provider]  apixaban  (ELIQUIS ) 5 MG TABS tablet Take 1 tablet (5 mg total) by mouth 2 (two) times daily. 09/19/21 11/18/21  Tiajuana Fluke, MD  colchicine  0.6 MG tablet Take 1 tablet (0.6 mg total) by mouth 2 (two) times daily. 09/08/21 10/08/21  Alphonsus Jeans, MD  metoprolol  tartrate (LOPRESSOR ) 25 MG tablet Take 1 tablet (25 mg total) by mouth 2 (two) times daily. 09/19/21 11/18/21  Tiajuana Fluke, MD  pantoprazole  (PROTONIX ) 40 MG tablet Take 1 tablet (40 mg total) by mouth daily. 09/20/21 11/19/21  Tiajuana Fluke, MD    Family History Family History  Problem Relation Age of Onset   Diabetes Mother    Lung cancer Father  Social History Social History   Tobacco Use   Smoking status: Never   Smokeless tobacco: Never  Vaping Use   Vaping status: Never Used  Substance Use Topics   Alcohol use: Yes    Comment: social   Drug use: No     Allergies   Contrast media [iodinated contrast media]   Review of Systems Review of Systems  Eyes:  Negative for visual disturbance.  Gastrointestinal:  Negative for nausea and vomiting.  Skin:  Positive for wound.  Neurological:  Negative for dizziness, syncope and headaches.     Physical Exam Triage Vital Signs ED Triage Vitals  Encounter Vitals Group     BP      Systolic BP Percentile      Diastolic BP Percentile      Pulse      Resp      Temp      Temp  src      SpO2      Weight      Height      Head Circumference      Peak Flow      Pain Score      Pain Loc      Pain Education      Exclude from Growth Chart    No data found.  Updated Vital Signs BP (!) 166/94 (BP Location: Left Arm)   Pulse 88   Temp 98.1 F (36.7 C) (Oral)   SpO2 95%   Visual Acuity Right Eye Distance:   Left Eye Distance:   Bilateral Distance:    Right Eye Near:   Left Eye Near:    Bilateral Near:     Physical Exam Vitals and nursing note reviewed.  Constitutional:      Appearance: Normal appearance. He is not ill-appearing.  HENT:     Head: Normocephalic and atraumatic.  Skin:    General: Skin is warm and dry.     Capillary Refill: Capillary refill takes less than 2 seconds.     Findings: No bruising or erythema.  Neurological:     General: No focal deficit present.     Mental Status: He is alert and oriented to person, place, and time.      UC Treatments / Results  Labs (all labs ordered are listed, but only abnormal results are displayed) Labs Reviewed - No data to display  EKG   Radiology No results found.  Procedures Procedures (including critical care time)  Medications Ordered in UC Medications - No data to display  Initial Impression / Assessment and Plan / UC Course  I have reviewed the triage vital signs and the nursing notes.  Pertinent labs & imaging results that were available during my care of the patient were reviewed by me and considered in my medical decision making (see chart for details).   Patient is a pleasant, nontoxic-appearing 78 year old male presenting for evaluation of head laceration as outlined in HPI above.  As you can see in image above, there is a 2 cm linear abrasion to the crown of the head.  Mild venous oozing is present.  The wound does not extend through the dermal layer.  I do not feel that there is a need for sutures or staples.  I also do not feel the patient needs prophylactic  antibiotics given that it is a scalp wound and it had sufficient bleeding at the time of injury to irrigate itself.  The patient is alert and oriented x 4,  moving all extremities independently, and acting appropriately.  However, the patient is on Eliquis  secondary to his atrial flutter and previous PE.  The history of anticoagulant therapy, coupled with the patient's age, necessitate head CT to rule out any developing intracranial process.  Given that I do not have CT capability at urgent care on the weekend I have advised the patient that he needs to be evaluated in the emergency department.  He and his wife are in agreement and they will go to [facility].   Final Clinical Impressions(s) / UC Diagnoses   Final diagnoses:  Laceration of scalp, initial encounter  Injury of head, initial encounter     Discharge Instructions      Please go to the emergency department at [facility] for a CT scan of your head given that you suffered a head injury and you are on Eliquis .  Please go now.   ED Prescriptions   None    PDMP not reviewed this encounter.   Kent Pear, NP 07/03/23 [phone]

## 2023-07-03 NOTE — Discharge Instructions (Addendum)
 Please go to the emergency department at [facility] for a CT scan of your head given that you suffered a head injury and you are on Eliquis .  Please go now.

## 2023-07-03 NOTE — ED Notes (Signed)
 Patient is being discharged from the Urgent Care and sent to the [facility] Emergency Department via private vehicle with wife . Per Kent Pear, NP, patient is in need of higher level of care due to head injury and on blood thinner. Patient is aware and verbalizes understanding of plan of care.  Vitals:   07/03/23 1557  BP: (!) 166/94  Pulse: 88  Temp: 98.1 F (36.7 C)  SpO2: 95%

## 2023-07-03 NOTE — ED Triage Notes (Addendum)
 Pt states that he was lifting the roof of a well when it was tipped to the side and a nail hit him on the head.   Pt states that he has a scratch on the top of his head that bled at time of injury. Pt states that it has stopped bleeding.  Pt denies blacking out, dizziness, or changes of vision.  Pt last tetanus was 12/25/2019

## 2023-08-09 ENCOUNTER — Ambulatory Visit: Admitting: Internal Medicine

## 2023-08-09 ENCOUNTER — Ambulatory Visit
Admission: RE | Admit: 2023-08-09 | Discharge: 2023-08-09 | Disposition: A | Discharge status: Home / Self Care | Source: Ambulatory Visit | Attending: Internal Medicine | Admitting: Internal Medicine

## 2023-08-09 ENCOUNTER — Encounter: Payer: Self-pay | Admitting: Internal Medicine

## 2023-08-09 VITALS — BP 120/70 | HR 86 | Temp 98.1°F | Ht 70.0 in | Wt 169.6 lb

## 2023-08-09 DIAGNOSIS — J45991 Cough variant asthma: Secondary | ICD-10-CM

## 2023-08-09 DIAGNOSIS — R053 Chronic cough: Secondary | ICD-10-CM

## 2023-08-09 DIAGNOSIS — R0602 Shortness of breath: Secondary | ICD-10-CM | POA: Diagnosis not present

## 2023-08-09 LAB — NITRIC OXIDE: Nitric Oxide: 46

## 2023-08-09 MED ORDER — FLUTICASONE PROPIONATE HFA 220 MCG/ACT IN AERO: 2.0000 | INHALATION_SPRAY | Freq: Two times a day (BID) | RESPIRATORY_TRACT | 2 refills | Status: AC

## 2023-08-09 NOTE — Patient Instructions (Addendum)
 Lets plan to start FLOVENT 2 puffs in the morning 2 puffs at night to help with cough and inflammation Please rinse mouth after use  Continue medications for reflux We will order a chest x-ray to look at lungs We will order a pulmonary function testing to assess your breathing  Avoid Allergens and Irritants Avoid secondhand smoke Avoid SICK contacts Recommend  Masking  when appropriate Recommend Keep up-to-date with vaccinations  ENT consultation to assess cough

## 2023-08-09 NOTE — Progress Notes (Unsigned)
 [facility] Pulmonary Medicine Consultation      Date: 08/09/2023,   MRN# [id number] Kevin Acosta 1945/07/21     CHIEF COMPLAINT:   Assessment for cough   HISTORY OF PRESENT ILLNESS   78 year old pleasant white male seen today for persistent cough since January 2025  Patient has been having a dry hacking cough nonproductive No associated shortness of breath dyspnea exertion or wheezing Patient diagnosed with flu in February 2025 This made the cough worse Patient is a non-smoker  Patient has had secondhand smoke exposure for about 15 years but not in the past 40 years Patient is on Protonix  for reflux Symptoms seem to be controlled  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 Patient on ARB for high blood pressure  Patient does have intermittent sinus congestion but is controlled with Claritin  as needed  Patient does have cough mostly in the morning and nighttime with the reclining and sitting positions  No exacerbation at this time No evidence of heart failure at this time No evidence or signs of infection at this time No respiratory distress No fevers, chills, nausea, vomiting, diarrhea No evidence of lower extremity edema No evidence hemoptysis   Assessment of ASTHMA FeNO  46  ppb-Elevated exhaled Nitric oxide testing is highly consistent with type II inflammation Findings relayed to patient we will plan for inhaled corticosteroid therapy   PAST MEDICAL HISTORY   Past Medical History:  Diagnosis Date   Atrial flutter (HCC)    Hyperlipidemia    Hypertension    PE (pulmonary thromboembolism) (HCC)      SURGICAL HISTORY   Past Surgical History:  Procedure Laterality Date   NO PAST SURGERIES     VISCERAL ANGIOGRAPHY N/A 07/02/2018   Procedure: VISCERAL ANGIOGRAPHY with possible embolization;  Surgeon: Marea Selinda RAMAN, MD;  Location: [facility] INVASIVE CV LAB;  Service: Cardiovascular;  Laterality: N/A;     FAMILY HISTORY   Family  History  Problem Relation Age of Onset   Diabetes Mother    Lung cancer Father      SOCIAL HISTORY   Social History   Tobacco Use   Smoking status: Never   Smokeless tobacco: Never  Vaping Use   Vaping status: Never Used  Substance Use Topics   Alcohol use: Yes    Comment: social   Drug use: No     MEDICATIONS    Home Medication:  Current Outpatient Rx   Order #: [id number] Class: Historical Med   Order #: [id number] Class: Historical Med   Order #: [id number] Class: Historical Med   Order #: [id number] Class: Historical Med   Order #: [id number] Class: Historical Med   Order #: [id number] Class: Historical Med   Order #: [id number] Class: Historical Med   Order #: [id number] Class: Historical Med   Order #: [id number] Class: Historical Med   Order #: [id number] Class: Historical Med   Order #: [id number] Class: Historical Med   Order #: [id number] Class: Normal   Order #: [id number] Class: Normal   Order #: [id number] Class: Normal    Current Medication:  Current Outpatient Medications:    ascorbic acid  (VITAMIN C) 500 MG tablet, Take 500 mg by mouth daily., Disp: , Rfl:    Cholecalciferol  (VITAMIN D3) 25 MCG (1000 UT) CAPS, Take 1,000 Units by mouth daily., Disp: , Rfl:    furosemide (LASIX) 20 MG tablet, Take 20 mg by mouth daily., Disp: , Rfl:    losartan  (COZAAR ) 50 MG tablet, Take 50 mg by mouth daily., Disp: , Rfl:    metoprolol  tartrate (  LOPRESSOR ) 50 MG tablet, Take 50 mg by mouth 2 (two) times daily., Disp: , Rfl:    Multiple Vitamin (MULTI-VITAMIN) tablet, Take 1 tablet by mouth daily., Disp: , Rfl:    Multiple Vitamins-Minerals (CENTRUM SILVER 50+MEN) TABS, Take 1 tablet by mouth daily., Disp: , Rfl:    Omega-3 Fatty Acids (FISH OIL OMEGA-3) 1000 MG CAPS, Take 1 capsule by mouth daily., Disp: , Rfl:    potassium chloride  (KLOR-CON ) 10 MEQ tablet, Take 10 mEq by mouth once. With Furosemide, Disp: , Rfl:    rosuvastatin (CRESTOR) 5 MG tablet, Take 5 mg by mouth daily., Disp: ,  Rfl:    Zinc  50 MG TABS, Take 50 mg by mouth daily., Disp: , Rfl:    apixaban  (ELIQUIS ) 5 MG TABS tablet, Take 1 tablet (5 mg total) by mouth 2 (two) times daily., Disp: 120 tablet, Rfl: 0   colchicine  0.6 MG tablet, Take 1 tablet (0.6 mg total) by mouth 2 (two) times daily., Disp: 60 tablet, Rfl: 0   pantoprazole  (PROTONIX ) 40 MG tablet, Take 1 tablet (40 mg total) by mouth daily., Disp: 30 tablet, Rfl: 1    ALLERGIES   Iodinated contrast media   BP 120/70 (BP Location: Right Arm, Patient Position: Sitting, Cuff Size: Large)   Pulse 86   Temp 98.1 F (36.7 C) (Oral)   Ht 5' 10 (1.778 m)   Wt 169 lb 9.6 oz (76.9 kg)   SpO2 96%   BMI 24.34 kg/m    Review of Systems: Gen:  Denies  fever, sweats, chills weight loss  HEENT: Denies blurred vision, double vision, ear pain, eye pain, hearing loss, nose bleeds, sore throat Cardiac:  No dizziness, chest pain or heaviness, chest tightness,edema, No JVD Resp:   + cough, -sputum production, -shortness of breath,-wheezing, -hemoptysis,  Other:  All other systems negative   Physical Examination:   General Appearance: No distress  EYES PERRLA, EOM intact.   NECK Supple, No JVD Pulmonary: normal breath sounds, No wheezing.  CardiovascularNormal S1,S2.  No m/r/g.   Abdomen: Benign, Soft, non-tender. 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 and in p.m. Rinse mouth after use  Avoid Allergens and Irritants Avoid secondhand smoke Avoid SICK  contacts Recommend  Masking  when appropriate Recommend Keep up-to-date with vaccinations No exacerbation at this time No evidence of heart failure at this time No evidence or signs of infection at this time No respiratory distress No fevers, chills, nausea, vomiting, diarrhea No evidence of lower extremity edema No evidence hemoptysis      MEDICATION ADJUSTMENTS/LABS AND TESTS ORDERED: start FLOVENT 2 puffs in the morning 2 puffs at night to help with cough and inflammation Please rinse mouth after use CXR PFT ENT consultation Avoid Allergens and Irritants Avoid secondhand smoke Avoid SICK contacts Recommend  Masking  when appropriate Recommend Keep up-to-date with vaccinations   CURRENT MEDICATIONS REVIEWED AT LENGTH WITH PATIENT TODAY   Patient  satisfied with Plan of action and management. All questions answered   Follow up 4 weeks   I spent a total  of 66 minutes dedicated to the care of this patient on the date of this encounter to include pre-visit review of records, face-to-face time with the patient discussing conditions above, post visit ordering of testing, clinical documentation with the electronic health record, making appropriate referrals as documented, and communicating necessary information to the patient's healthcare team.    The Patient requires high complexity decision making for assessment and support, frequent evaluation and titration of therapies, application of advanced monitoring technologies and extensive interpretation of multiple databases.  Patient satisfied with Plan of action and management. All questions answered    Nickolas Alm Cellar, M.D.  [facility]  Medical Director [facility] Medical Director [facility] Cardio-Pulmonary Department

## 2023-08-19 DIAGNOSIS — J309 Allergic rhinitis, unspecified: Secondary | ICD-10-CM | POA: Diagnosis not present

## 2023-08-19 DIAGNOSIS — R059 Cough, unspecified: Secondary | ICD-10-CM | POA: Diagnosis not present

## 2023-08-19 DIAGNOSIS — J301 Allergic rhinitis due to pollen: Secondary | ICD-10-CM | POA: Diagnosis not present

## 2023-08-19 DIAGNOSIS — H6123 Impacted cerumen, bilateral: Secondary | ICD-10-CM | POA: Diagnosis not present

## 2023-08-19 DIAGNOSIS — R0981 Nasal congestion: Secondary | ICD-10-CM | POA: Diagnosis not present

## 2023-08-20 ENCOUNTER — Telehealth: Payer: Self-pay

## 2023-08-20 ENCOUNTER — Encounter: Payer: Self-pay | Admitting: Internal Medicine

## 2023-08-20 ENCOUNTER — Other Ambulatory Visit (HOSPITAL_COMMUNITY): Payer: Self-pay

## 2023-08-20 ENCOUNTER — Other Ambulatory Visit: Payer: Self-pay | Admitting: Unknown Physician Specialty

## 2023-08-20 DIAGNOSIS — R0981 Nasal congestion: Secondary | ICD-10-CM

## 2023-08-20 DIAGNOSIS — R059 Cough, unspecified: Secondary | ICD-10-CM

## 2023-08-20 NOTE — Telephone Encounter (Signed)
*  Pulm  Pharmacy Patient Advocate Encounter   Received notification from CoverMyMeds that prior authorization for Fluticasone  Propionate HFA 220MCG/ACT aerosol  is required/requested.   Insurance verification completed.   The patient is insured through [employer] .   Per test claim:  Arnuity Ellipta  is preferred by the insurance.  If suggested medication is appropriate, Please send in a new RX and discontinue this one. If not, please advise as to why it's not appropriate so that we may request a Prior Authorization. Please note, some preferred medications may still require a PA.  If the suggested medications have not been trialed and there are no contraindications to their use, the PA will not be submitted, as it will not be approved.   Per test claim: Arnuity-$40.00/30 days $80.00/90 days through [facility]

## 2023-08-24 DIAGNOSIS — I10 Essential (primary) hypertension: Secondary | ICD-10-CM | POA: Diagnosis not present

## 2023-08-24 DIAGNOSIS — I4892 Unspecified atrial flutter: Secondary | ICD-10-CM | POA: Diagnosis not present

## 2023-08-24 DIAGNOSIS — E7800 Pure hypercholesterolemia, unspecified: Secondary | ICD-10-CM | POA: Diagnosis not present

## 2023-08-25 ENCOUNTER — Inpatient Hospital Stay
Admission: RE | Admit: 2023-08-25 | Discharge: 2023-08-25 | Discharge status: Home / Self Care | Source: Ambulatory Visit | Attending: Unknown Physician Specialty | Admitting: Unknown Physician Specialty

## 2023-08-25 DIAGNOSIS — J342 Deviated nasal septum: Secondary | ICD-10-CM | POA: Diagnosis not present

## 2023-08-25 DIAGNOSIS — R0981 Nasal congestion: Secondary | ICD-10-CM

## 2023-08-25 DIAGNOSIS — R059 Cough, unspecified: Secondary | ICD-10-CM

## 2023-08-25 NOTE — Telephone Encounter (Signed)
 Arnuity Ellipta  is preferred by the insurance.  If suggested medication is appropriate, Please send in a new RX and discontinue this one. If not, please advise as to why it's not appropriate so that we may request a Prior Authorization. Please note, some preferred medications may still require a PA.  If the suggested medications have not been trialed and there are no contraindications to their use, the PA will not be submitted, as it will not be approved.    Per test claim: Arnuity-$40.00/30 days $80.00/90 days through [facility]

## 2023-08-26 ENCOUNTER — Other Ambulatory Visit: Payer: Self-pay | Admitting: Internal Medicine

## 2023-08-26 DIAGNOSIS — R0602 Shortness of breath: Secondary | ICD-10-CM

## 2023-08-26 MED ORDER — ARNUITY ELLIPTA 100 MCG/ACT IN AEPB: 1.0000 | INHALATION_SPRAY | Freq: Every day | RESPIRATORY_TRACT | 1 refills | Status: AC

## 2023-08-27 NOTE — Telephone Encounter (Signed)
 Noted, NFN

## 2023-09-02 NOTE — Telephone Encounter (Signed)
 Arnuity prescribed per Dr. Isaiah.

## 2023-09-29 DIAGNOSIS — J301 Allergic rhinitis due to pollen: Secondary | ICD-10-CM | POA: Diagnosis not present

## 2023-10-06 ENCOUNTER — Ambulatory Visit: Admitting: Internal Medicine

## 2023-10-06 ENCOUNTER — Encounter: Payer: Self-pay | Admitting: Internal Medicine

## 2023-10-06 VITALS — BP 100/80 | HR 71 | Temp 98.3°F | Ht 70.0 in | Wt 170.6 lb

## 2023-10-06 DIAGNOSIS — J45991 Cough variant asthma: Secondary | ICD-10-CM | POA: Diagnosis not present

## 2023-10-06 DIAGNOSIS — R053 Chronic cough: Secondary | ICD-10-CM

## 2023-10-06 DIAGNOSIS — J302 Other seasonal allergic rhinitis: Secondary | ICD-10-CM | POA: Diagnosis not present

## 2023-10-06 LAB — PULMONARY FUNCTION TEST
DL/VA % pred: 91 %
DL/VA: 3.53 ml/min/mmHg/L
DLCO unc % pred: 87 %
DLCO unc: 21.41 ml/min/mmHg
FEF 25-75 Post: 2.89 L/s
FEF 25-75 Pre: 2.8 L/s
FEF2575-%Change-Post: 3 %
FEF2575-%Pred-Post: 139 %
FEF2575-%Pred-Pre: 135 %
FEV1-%Change-Post: 0 %
FEV1-%Pred-Post: 102 %
FEV1-%Pred-Pre: 102 %
FEV1-Post: 3.03 L
FEV1-Pre: 3.02 L
FEV1FVC-%Change-Post: 4 %
FEV1FVC-%Pred-Pre: 108 %
FEV6-%Change-Post: -4 %
FEV6-%Pred-Post: 96 %
FEV6-%Pred-Pre: 100 %
FEV6-Post: 3.7 L
FEV6-Pre: 3.86 L
FEV6FVC-%Pred-Post: 106 %
FEV6FVC-%Pred-Pre: 106 %
FVC-%Change-Post: -4 %
FVC-%Pred-Post: 89 %
FVC-%Pred-Pre: 93 %
FVC-Post: 3.7 L
FVC-Pre: 3.86 L
Post FEV1/FVC ratio: 82 %
Post FEV6/FVC ratio: 100 %
Pre FEV1/FVC ratio: 78 %
Pre FEV6/FVC Ratio: 100 %
RV % pred: 94 %
RV: 2.46 L
TLC % pred: 87 %
TLC: 6.18 L

## 2023-10-06 NOTE — Patient Instructions (Signed)
 Full PFT completed today ? ?

## 2023-10-06 NOTE — Progress Notes (Signed)
 Full PFT completed today ? ?

## 2023-10-06 NOTE — Patient Instructions (Addendum)
 Recommend starting ARNUITY as requested by your insurance company Please rinse mouth after every use  We discussed your breathing tests and your chest x-ray results today   Avoid Allergens and Irritants Avoid secondhand smoke Avoid SICK contacts Recommend  Masking  when appropriate Recommend Keep up-to-date with vaccinations

## 2023-10-06 NOTE — Progress Notes (Signed)
 [facility] [place] Pulmonary Medicine Consultation      Date: 10/06/2023,   MRN# [id number] Kevin Acosta 11-22-1945   Tests February 2025  assessment of ASTHMA FeNO  46  ppb-Elevated exhaled Nitric oxide  testing is highly consistent with type II inflammation  October 06, 2023 pulmonary function testing Postbronchodilator FEV1 FVC ratio is 82% predicted FEV1 is 1 to 2% predicted FVC is 89% predicted No significant bronchodilator response Total lung capacity 87% predicted RV is 94% predicted DLCO is 87% predicted Flow-volume loop shows minimal obstructive pattern Overall interpretation normal pulmonary function testing  CHIEF COMPLAINT:  Follow-up assessment for follow-up  HISTORY OF PRESENT ILLNESS   78 year old pleasant white male seen today for persistent cough since January 2025 Patient is a non-smoker  Patient has had secondhand smoke exposure for about 15 years but not in the past 40 years Patient is on Protonix  for reflux Symptoms seem to be controlled  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 Patient on ARB for high blood pressure  Patient does have intermittent sinus congestion but is controlled with Claritin  as needed  No exacerbation at this time No evidence of heart failure at this time No evidence or signs of infection at this time No respiratory distress No fevers, chills, nausea, vomiting, diarrhea No evidence of lower extremity edema No evidence hemoptysis  Patient underwent ENT evaluation no significant findings Patient was started on Flonase  nasal spray Patient underwent allergy testing and is allergic to grasses pollen trees and cockroaches Not allergic to cats and dogs  Patient previously had elevated Feno 46 was prescribed inhaled corticosteroids however insurance company impeded his therapy I had been prescribed Arnuity which is an inhaled corticosteroid patient has not started any type of therapy at this  time   Findings relayed to patient we will plan for inhaled corticosteroid therapy Pulmonary function test and chest x-ray reviewed with patient in detail   PAST MEDICAL HISTORY   Past Medical History:  Diagnosis Date   Atrial flutter (HCC)    Hyperlipidemia    Hypertension    PE (pulmonary thromboembolism) (HCC)      SURGICAL HISTORY   Past Surgical History:  Procedure Laterality Date   NO PAST SURGERIES     VISCERAL ANGIOGRAPHY N/A 07/02/2018   Procedure: VISCERAL ANGIOGRAPHY with possible embolization;  Surgeon: Marea Selinda RAMAN, MD;  Location: [facility] INVASIVE CV LAB;  Service: Cardiovascular;  Laterality: N/A;     FAMILY HISTORY   Family History  Problem Relation Age of Onset   Diabetes Mother    Lung cancer Father      SOCIAL HISTORY   Social History   Tobacco Use   Smoking status: Never   Smokeless tobacco: Never  Vaping Use   Vaping status: Never Used  Substance Use Topics   Alcohol use: Yes    Comment: social   Drug use: No     MEDICATIONS    Home Medication:  Current Outpatient Rx   Order #: [id number] Class: Normal   Order #: [id number] Class: Historical Med   Order #: [id number] Class: Historical Med   Order #: [id number] Class: Historical Med   Order #: [id number] Class: Historical Med   Order #: [id number] Class: Normal   Order #: [id number] Class: Normal   Order #: [id number] Class: Historical Med   Order #: [id number] Class: Historical Med   Order #: [id number] Class: Historical Med   Order #: [id number] Class: Historical Med   Order #: [id number] Class: Historical Med   Order #: [id number] Class: Historical Med   Order #:  [id number] Class: Normal   Order #: [id number] Class: Historical Med   Order #: [id number] Class: Historical Med   Order #: [id number] Class: Historical Med   Order #: [id number] Class: Normal    Current Medication:  Current Outpatient Medications:    apixaban  (ELIQUIS ) 5 MG TABS tablet, Take 1 tablet (5 mg total) by mouth 2 (two) times daily.,  Disp: 120 tablet, Rfl: 0   ascorbic acid  (VITAMIN C) 500 MG tablet, Take 500 mg by mouth daily., Disp: , Rfl:    Cholecalciferol  (VITAMIN D3) 25 MCG (1000 UT) CAPS, Take 1,000 Units by mouth daily., Disp: , Rfl:    EPINEPHrine 0.3 mg/0.3 mL IJ SOAJ injection, Inject 0.3 mg into the muscle as needed for anaphylaxis., Disp: , Rfl:    fluticasone  (FLONASE ) 50 MCG/ACT nasal spray, Place 2 sprays into both nostrils daily., Disp: , Rfl:    fluticasone  (FLOVENT  HFA) 220 MCG/ACT inhaler, Inhale 2 puffs into the lungs 2 (two) times daily., Disp: 1 each, Rfl: 2   Fluticasone  Furoate (ARNUITY ELLIPTA ) 100 MCG/ACT AEPB, Inhale 1 Act into the lungs daily., Disp: 30 each, Rfl: 1   furosemide (LASIX) 20 MG tablet, Take 20 mg by mouth daily., Disp: , Rfl:    losartan  (COZAAR ) 50 MG tablet, Take 50 mg by mouth daily., Disp: , Rfl:    metoprolol  tartrate (LOPRESSOR ) 50 MG tablet, Take 50 mg by mouth 2 (two) times daily., Disp: , Rfl:    Multiple Vitamin (MULTI-VITAMIN) tablet, Take 1 tablet by mouth daily., Disp: , Rfl:    Multiple Vitamins-Minerals (CENTRUM SILVER 50+MEN) TABS, Take 1 tablet by mouth daily., Disp: , Rfl:    Omega-3 Fatty Acids (FISH OIL OMEGA-3) 1000 MG CAPS, Take 1 capsule by mouth daily., Disp: , Rfl:    pantoprazole  (PROTONIX ) 40 MG tablet, Take 1 tablet (40 mg total) by mouth daily., Disp: 30 tablet, Rfl: 1   potassium chloride  (KLOR-CON ) 10 MEQ tablet, Take 10 mEq by mouth once. With Furosemide, Disp: , Rfl:    rosuvastatin (CRESTOR) 5 MG tablet, Take 5 mg by mouth daily., Disp: , Rfl:    Zinc  50 MG TABS, Take 50 mg by mouth daily., Disp: , Rfl:    colchicine  0.6 MG tablet, Take 1 tablet (0.6 mg total) by mouth 2 (two) times daily. (Patient not taking: Reported on 10/06/2023), Disp: 60 tablet, Rfl: 0    ALLERGIES   Iodinated contrast media, American cockroach, Grass pollen([id number]-swt vern), and Tree extract   BP 100/80   Pulse 71   Temp 98.3 F (36.8 C)   Ht 5' 10 (1.778 m)   Wt  170 lb 9.6 oz (77.4 kg)   SpO2 99%   BMI 24.48 kg/m      Review of Systems: Gen:  Denies  fever, sweats, chills weight loss  HEENT: Denies blurred vision, double vision, ear pain, eye pain, hearing loss, nose bleeds, sore throat Cardiac:  No dizziness, chest pain or heaviness, chest tightness,edema, No JVD Resp:   No cough, -sputum production, -shortness of breath,-wheezing, -hemoptysis,  Other:  All other systems negative   Physical Examination:   General Appearance: No distress  EYES PERRLA, EOM intact.   NECK Supple, No JVD Pulmonary: normal breath sounds, No wheezing.  CardiovascularNormal S1,S2.  No m/r/g.   Abdomen: Benign, Soft, non-tender. 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  Assessment & Plan Cough variant asthma After further evaluation patient likely to have cough variant asthma Recommend starting Arnuity Please rinse mouth after use Avoid Allergens and Irritants Avoid secondhand smoke Avoid SICK contacts Recommend  Masking  when appropriate Recommend Keep up-to-date with vaccinations No exacerbation at this time No evidence of heart failure at this time No evidence or signs of infection at this time No respiratory distress No fevers, chills, nausea, vomiting, diarrhea No evidence of lower extremity edema No evidence hemoptysis Seasonal allergic rhinitis, unspecified trigger Allergic rhinitis Continue Flonase  and Claritin  as prescribed  MEDICATION ADJUSTMENTS/LABS AND TESTS ORDERED: Start Arnuity Please rinse mouth after use Avoid Allergens and Irritants Avoid secondhand smoke Avoid SICK contacts Recommend  Masking  when  appropriate Recommend Keep up-to-date with vaccinations   CURRENT MEDICATIONS REVIEWED AT LENGTH WITH PATIENT TODAY   Patient  satisfied with Plan of action and management. All questions answered   Follow up 6 months   I spent a total of 42 minutes dedicated to the care of this patient on the date of this encounter to include pre-visit review of records, face-to-face time with the patient discussing conditions above, post visit ordering of testing, clinical documentation with the electronic health record, making appropriate referrals as documented, and communicating necessary information to the patient's healthcare team.    The Patient requires high complexity decision making for assessment and support, frequent evaluation and titration of therapies, application of advanced monitoring technologies and extensive interpretation of multiple databases.  Patient satisfied with Plan of action and management. All questions answered    Nickolas Alm Cellar, M.D.  [facility]  Medical Director [facility] Medical Director [facility] Cardio-Pulmonary Department

## 2023-12-22 DIAGNOSIS — E1165 Type 2 diabetes mellitus with hyperglycemia: Secondary | ICD-10-CM | POA: Diagnosis not present

## 2023-12-22 DIAGNOSIS — I4892 Unspecified atrial flutter: Secondary | ICD-10-CM | POA: Diagnosis not present

## 2023-12-22 DIAGNOSIS — E7800 Pure hypercholesterolemia, unspecified: Secondary | ICD-10-CM | POA: Diagnosis not present

## 2023-12-22 DIAGNOSIS — Z1331 Encounter for screening for depression: Secondary | ICD-10-CM | POA: Diagnosis not present

## 2023-12-22 DIAGNOSIS — Z0000 Encounter for general adult medical examination without abnormal findings: Secondary | ICD-10-CM | POA: Diagnosis not present

## 2023-12-22 DIAGNOSIS — Z125 Encounter for screening for malignant neoplasm of prostate: Secondary | ICD-10-CM | POA: Diagnosis not present

## 2023-12-22 DIAGNOSIS — E119 Type 2 diabetes mellitus without complications: Secondary | ICD-10-CM | POA: Diagnosis not present

## 2023-12-22 DIAGNOSIS — Z8679 Personal history of other diseases of the circulatory system: Secondary | ICD-10-CM | POA: Diagnosis not present
